# Patient Record
Sex: Female | Born: 1980 | Hispanic: Yes | Marital: Married | Smoking: Never smoker
Health system: Southern US, Community
[De-identification: ages and names within clinical notes are randomized; demographics above are authoritative.]

## PROBLEM LIST (undated history)

## (undated) DIAGNOSIS — K219 Gastro-esophageal reflux disease without esophagitis: Secondary | ICD-10-CM

## (undated) HISTORY — DX: Gastro-esophageal reflux disease without esophagitis: K21.9

---

## 2016-08-02 ENCOUNTER — Other Ambulatory Visit: Payer: Self-pay | Admitting: Family Medicine

## 2016-08-02 ENCOUNTER — Other Ambulatory Visit (HOSPITAL_COMMUNITY)
Admission: RE | Admit: 2016-08-02 | Discharge: 2016-08-02 | Disposition: A | Discharge status: Home / Self Care | Payer: BLUE CROSS/BLUE SHIELD | Source: Ambulatory Visit | Attending: Family Medicine | Admitting: Family Medicine

## 2016-08-02 DIAGNOSIS — Z124 Encounter for screening for malignant neoplasm of cervix: Secondary | ICD-10-CM | POA: Diagnosis present

## 2016-08-02 DIAGNOSIS — Z113 Encounter for screening for infections with a predominantly sexual mode of transmission: Secondary | ICD-10-CM | POA: Insufficient documentation

## 2016-08-02 DIAGNOSIS — Z1151 Encounter for screening for human papillomavirus (HPV): Secondary | ICD-10-CM | POA: Diagnosis present

## 2016-08-05 LAB — CYTOLOGY - PAP
Bacterial vaginitis: POSITIVE — AB
CHLAMYDIA, DNA PROBE: NEGATIVE
Candida vaginitis: NEGATIVE
Diagnosis: NEGATIVE
HPV: NOT DETECTED
NEISSERIA GONORRHEA: NEGATIVE
TRICH (WINDOWPATH): NEGATIVE

## 2017-01-21 ENCOUNTER — Ambulatory Visit (INDEPENDENT_AMBULATORY_CARE_PROVIDER_SITE_OTHER): Payer: BLUE CROSS/BLUE SHIELD | Admitting: Emergency Medicine

## 2017-01-21 ENCOUNTER — Encounter: Payer: Self-pay | Admitting: Emergency Medicine

## 2017-01-21 VITALS — BP 124/82 | HR 81 | Temp 98.1°F | Resp 18 | Ht 61.75 in | Wt 120.6 lb

## 2017-01-21 DIAGNOSIS — R1013 Epigastric pain: Secondary | ICD-10-CM | POA: Diagnosis not present

## 2017-01-21 DIAGNOSIS — R002 Palpitations: Secondary | ICD-10-CM | POA: Diagnosis not present

## 2017-01-21 DIAGNOSIS — G8929 Other chronic pain: Secondary | ICD-10-CM | POA: Diagnosis not present

## 2017-01-21 DIAGNOSIS — G44219 Episodic tension-type headache, not intractable: Secondary | ICD-10-CM | POA: Diagnosis not present

## 2017-01-21 MED ORDER — DICLOFENAC SODIUM 75 MG PO TBEC: 75.0000 mg | DELAYED_RELEASE_TABLET | Freq: Two times a day (BID) | ORAL | 0 refills | Status: AC

## 2017-01-21 NOTE — Progress Notes (Signed)
Kaitlin Suarez 36 y.o.   Chief Complaint  Patient presents with  . Headache    over a year. They wont go away     HISTORY OF PRESENT ILLNESS: This is a 36 y.o. female complaining of headache on and off for 1 year; married; from [place] two sons still in the [place] and trying to bring them back; high stress; works 6 days a week; works out regularly; has ocassional palpitations with intermittent bouts of epigastric pain; moves bowels 3-4 times/day, formed but soft.  Headache   This is a chronic problem. The current episode started more than 1 year ago. The problem occurs intermittently. The problem has been waxing and waning. The pain is located in the bilateral region. The pain does not radiate. The pain quality is similar to prior headaches. The quality of the pain is described as aching and band-like. Associated symptoms include abdominal pain. Pertinent negatives include no back pain, coughing, dizziness, fever, nausea, neck pain, seizures, sore throat, vomiting or weight loss. Photophobia: with headaches.     Prior to Admission medications   Not on File    No Known Allergies  There are no active problems to display for this patient.   History reviewed. No pertinent past medical history.  Past Surgical History:  Procedure Laterality Date  . CESAREAN SECTION      Social History   Social History  . Marital status: Married    Spouse name: N/A  . Number of children: N/A  . Years of education: N/A   Occupational History  . Not on file.   Social History Main Topics  . Smoking status: Never Smoker  . Smokeless tobacco: Never Used  . Alcohol use Not on file  . Drug use: Unknown  . Sexual activity: Not on file   Other Topics Concern  . Not on file   Social History Narrative  . No narrative on file    History reviewed. No pertinent family history.   Review of Systems  Constitutional: Negative.  Negative for chills, fever and weight loss.  HENT:  Negative.  Negative for congestion, nosebleeds and sore throat.   Eyes: Photophobia: with headaches.  Respiratory: Negative for cough, hemoptysis and shortness of breath.   Cardiovascular: Positive for palpitations.  Gastrointestinal: Positive for abdominal pain. Negative for nausea and vomiting.  Genitourinary: Negative for dysuria and hematuria.  Musculoskeletal: Negative for back pain, myalgias and neck pain.  Skin: Negative.  Negative for rash.  Neurological: Positive for headaches. Negative for dizziness, sensory change, focal weakness, seizures and loss of consciousness.  Psychiatric/Behavioral: The patient is nervous/anxious.   All other systems reviewed and are negative.  Vitals:   01/21/17 0833  BP: 124/82  Pulse: 81  Resp: 18  Temp: 98.1 F (36.7 C)     Physical Exam  Constitutional: She is oriented to person, place, and time. She appears well-developed and well-nourished.  HENT:  Head: Normocephalic and atraumatic.  Right Ear: External ear normal.  Left Ear: External ear normal.  Nose: Nose normal.  Mouth/Throat: Oropharynx is clear and moist.  Eyes: Conjunctivae and EOM are normal. Pupils are equal, round, and reactive to light.  Neck: Normal range of motion. Neck supple. No JVD present. No thyromegaly present.  Cardiovascular: Normal rate, regular rhythm, normal heart sounds and intact distal pulses.   Pulmonary/Chest: Effort normal and breath sounds normal.  Abdominal: Soft. Bowel sounds are normal. She exhibits no distension. There is no tenderness.  Musculoskeletal: Normal range of motion.  Lymphadenopathy:    She has no cervical adenopathy.  Neurological: She is alert and oriented to person, place, and time. She displays normal reflexes. No cranial nerve deficit or sensory deficit. She exhibits normal muscle tone. Coordination normal.  Skin: Skin is warm and dry. Capillary refill takes less than 2 seconds.  Psychiatric: She has a normal mood and affect. Her  behavior is normal.  Vitals reviewed.    ASSESSMENT & PLAN: Kaitlin Suarez was seen today for headache.  Diagnoses and all orders for this visit:  Episodic tension-type headache, not intractable -     CBC with Differential/Platelet -     Comprehensive metabolic panel -     TSH -     Sedimentation Rate -     diclofenac (VOLTAREN) 75 MG EC tablet; Take 1 tablet (75 mg total) by mouth 2 (two) times daily. As needed for headaches.  Palpitations -     Ambulatory referral to Cardiology  Abdominal pain, chronic, epigastric Comments: suspected IBS Orders: -     Ambulatory referral to Gastroenterology  Other orders -     Cancel: POCT urinalysis dipstick -     Cancel: Urine Culture -     Cancel: Urine Microscopic    Patient Instructions       IF you received an x-ray today, you will receive an invoice from [facility]. Please contact [facility] at [phone] with questions or concerns regarding your invoice.   IF you received labwork today, you will receive an invoice from [facility]. Please contact [facility] at [phone] with questions or concerns regarding your invoice.   Our billing staff will not be able to assist you with questions regarding bills from these companies.  You will be contacted with the lab results as soon as they are available. The fastest way to get your results is to activate your My Chart account. Instructions are located on the last page of this paperwork. If you have not heard from [place] regarding the results in 2 weeks, please contact this office.      Cefalea tensional (Tension Headache) Una cefalea tensional es un dolor o presin que se siente en la frente y los lados de la cabeza. Estas cefaleas pueden durar de 4minutos a varios das. CUIDADOS EN EL HOGAR Control del [employer] de venta libre y los recetados solamente como se lo haya indicado el mdico.  Cuando sienta dolor de cabeza acustese en un cuarto  oscuro y tranquilo.  Si se lo indican, aplquese hielo en la zona de la cabeza y el cuello: ? Ponga el hielo en una bolsa plstica. ? Coloque una [employer] piel y la bolsa de hielo. ? Coloque el hielo durante 69minutos, 2 a 3veces por da.  Utilice una almohadilla trmica o tome una ducha caliente para aplicar calor en la zona de la cabeza y el cuello segn las indicaciones del mdico. Comida y bebida  Mantenga un horario para las comidas.  No beba mucho alcohol.  No consuma mucha cafena ni deje de consumirla por completo. Instrucciones generales  Concurra a todas las visitas de control como se lo haya indicado el mdico. Esto es importante.  Lleve un registro diario para Neurosurgeon si ciertas cosas provocan los dolores de [place]. Por ejemplo, escriba los siguientes datos: ? Lo que usted come y bebe. ? Cunto tiempo duerme. ? Algn cambio en su dieta o en los medicamentos.  Pruebe recibir Engineer, production o hacer otras cosas que lo ayuden a  relajarse.  Disminuya el nivel de estrs.  Sintese con la espalda recta. No contraiga (tensione) los msculos.  No consuma productos que contengan tabaco. Estos incluyen cigarrillos, tabaco para mascar y Psychologist, sport and exercise. Si necesita ayuda para dejar de fumar, consulte al mdico.  Haga ejercicios con regularidad tal como se lo indic el mdico.  Duerma lo suficiente. Es Software engineer, entre 7 y 9horas de sueo. SOLICITE AYUDA SI:  Los medicamentos no logran [employer].  Tiene un dolor de cabeza que es diferente del dolor de cabeza habitual.  Tiene Tree surgeon estomacal (nuseas) o vomita.  Tiene fiebre. SOLICITE AYUDA DE INMEDIATO SI:  La cefalea es muy intensa.  Sigue vomitando.  Presenta rigidez en el cuello.  Tiene dificultad para ver.  Tiene dificultad para hablar.  Siente dolor en el ojo o en el odo.  Sus msculos estn dbiles, o pierde el control muscular.  Pierde el equilibrio o tiene problemas para  Writer.  Siente que se desvanece (pierde el conocimiento) o se desmaya.  Se siente confundido. Esta informacin no tiene Marine scientist el consejo del mdico. Asegrese de hacerle al mdico cualquier pregunta que tenga. Document Released: 10/04/2011 Document Revised: 04/02/2015 Document Reviewed: 11/04/2014 Elsevier Interactive Patient Education  2018 [employer].      Agustina Caroli, MD [facility]

## 2017-01-21 NOTE — Patient Instructions (Addendum)
     IF you received an x-ray today, you will receive an invoice from [facility]. Please contact [facility] at [phone] with questions or concerns regarding your invoice.   IF you received labwork today, you will receive an invoice from [facility]. Please contact [facility] at [phone] with questions or concerns regarding your invoice.   Our billing staff will not be able to assist you with questions regarding bills from these companies.  You will be contacted with the lab results as soon as they are available. The fastest way to get your results is to activate your My Chart account. Instructions are located on the last page of this paperwork. If you have not heard from us regarding the results in 2 weeks, please contact this office.      Cefalea tensional (Tension Headache) Una cefalea tensional es un dolor o presin que se siente en la frente y los lados de la cabeza. Estas cefaleas pueden durar de 30minutos a varios das. CUIDADOS EN EL HOGAR Control del dolor  Tome los medicamentos de venta libre y los recetados solamente como se lo haya indicado el mdico.  Cuando sienta dolor de cabeza acustese en un cuarto oscuro y tranquilo.  Si se lo indican, aplquese hielo en la zona de la cabeza y el cuello: ? Ponga el hielo en una bolsa plstica. ? Coloque una toalla entre la piel y la bolsa de hielo. ? Coloque el hielo durante 20minutos, 2 a 3veces por da.  Utilice una almohadilla trmica o tome una ducha caliente para aplicar calor en la zona de la cabeza y el cuello segn las indicaciones del mdico. Comida y bebida  Mantenga un horario para las comidas.  No beba mucho alcohol.  No consuma mucha cafena ni deje de consumirla por completo. Instrucciones generales  Concurra a todas las visitas de control como se lo haya indicado el mdico. Esto es importante.  Lleve un registro diario para averiguar si ciertas cosas provocan los dolores de cabeza.  Por ejemplo, escriba los siguientes datos: ? Lo que usted come y bebe. ? Cunto tiempo duerme. ? Algn cambio en su dieta o en los medicamentos.  Pruebe recibir un masaje o hacer otras cosas que lo ayuden a relajarse.  Disminuya el nivel de estrs.  Sintese con la espalda recta. No contraiga (tensione) los msculos.  No consuma productos que contengan tabaco. Estos incluyen cigarrillos, tabaco para mascar y cigarrillos electrnicos. Si necesita ayuda para dejar de fumar, consulte al mdico.  Haga ejercicios con regularidad tal como se lo indic el mdico.  Duerma lo suficiente. Es decir, entre 7 y 9horas de sueo. SOLICITE AYUDA SI:  Los medicamentos no logran aliviar los sntomas.  Tiene un dolor de cabeza que es diferente del dolor de cabeza habitual.  Tiene malestar estomacal (nuseas) o vomita.  Tiene fiebre. SOLICITE AYUDA DE INMEDIATO SI:  La cefalea es muy intensa.  Sigue vomitando.  Presenta rigidez en el cuello.  Tiene dificultad para ver.  Tiene dificultad para hablar.  Siente dolor en el ojo o en el odo.  Sus msculos estn dbiles, o pierde el control muscular.  Pierde el equilibrio o tiene problemas para caminar.  Siente que se desvanece (pierde el conocimiento) o se desmaya.  Se siente confundido. Esta informacin no tiene como fin reemplazar el consejo del mdico. Asegrese de hacerle al mdico cualquier pregunta que tenga. Document Released: 10/04/2011 Document Revised: 04/02/2015 Document Reviewed: 11/04/2014 Elsevier Interactive Patient Education  2018 Elsevier Inc.  

## 2017-01-22 LAB — CBC WITH DIFFERENTIAL/PLATELET
BASOS ABS: 0 10*3/uL (ref 0.0–0.2)
BASOS: 0 %
EOS (ABSOLUTE): 0.1 10*3/uL (ref 0.0–0.4)
EOS: 1 %
HEMATOCRIT: 44.5 % (ref 34.0–46.6)
HEMOGLOBIN: 15.1 g/dL (ref 11.1–15.9)
IMMATURE GRANS (ABS): 0 10*3/uL (ref 0.0–0.1)
Immature Granulocytes: 0 %
LYMPHS ABS: 1.5 10*3/uL (ref 0.7–3.1)
LYMPHS: 29 %
MCH: 31.8 pg (ref 26.6–33.0)
MCHC: 33.9 g/dL (ref 31.5–35.7)
MCV: 94 fL (ref 79–97)
MONOCYTES: 8 %
Monocytes Absolute: 0.4 10*3/uL (ref 0.1–0.9)
NEUTROS ABS: 3.4 10*3/uL (ref 1.4–7.0)
Neutrophils: 62 %
Platelets: 246 10*3/uL (ref 150–379)
RBC: 4.75 x10E6/uL (ref 3.77–5.28)
RDW: 12.8 % (ref 12.3–15.4)
WBC: 5.4 10*3/uL (ref 3.4–10.8)

## 2017-01-22 LAB — COMPREHENSIVE METABOLIC PANEL
ALBUMIN: 4.3 g/dL (ref 3.5–5.5)
ALT: 13 IU/L (ref 0–32)
AST: 17 IU/L (ref 0–40)
Albumin/Globulin Ratio: 1.8 (ref 1.2–2.2)
Alkaline Phosphatase: 49 IU/L (ref 39–117)
BILIRUBIN TOTAL: 0.4 mg/dL (ref 0.0–1.2)
BUN / CREAT RATIO: 12 (ref 9–23)
BUN: 8 mg/dL (ref 6–20)
CO2: 24 mmol/L (ref 20–29)
CREATININE: 0.65 mg/dL (ref 0.57–1.00)
Calcium: 9.5 mg/dL (ref 8.7–10.2)
Chloride: 106 mmol/L (ref 96–106)
GFR calc non Af Amer: 115 mL/min/{1.73_m2} (ref >59)
GFR, EST AFRICAN AMERICAN: 132 mL/min/{1.73_m2} (ref >59)
GLOBULIN, TOTAL: 2.4 g/dL (ref 1.5–4.5)
GLUCOSE: 77 mg/dL (ref 65–99)
Potassium: 4.5 mmol/L (ref 3.5–5.2)
SODIUM: 142 mmol/L (ref 134–144)
TOTAL PROTEIN: 6.7 g/dL (ref 6.0–8.5)

## 2017-01-22 LAB — SEDIMENTATION RATE: Sed Rate: 2 mm/hr (ref 0–32)

## 2017-01-22 LAB — TSH: TSH: 1.71 u[IU]/mL (ref 0.450–4.500)

## 2017-03-21 ENCOUNTER — Ambulatory Visit (INDEPENDENT_AMBULATORY_CARE_PROVIDER_SITE_OTHER): Payer: BLUE CROSS/BLUE SHIELD | Admitting: Emergency Medicine

## 2017-03-21 ENCOUNTER — Encounter: Payer: Self-pay | Admitting: Emergency Medicine

## 2017-03-21 VITALS — BP 125/81 | HR 81 | Temp 98.5°F | Resp 16 | Ht 61.0 in | Wt 119.0 lb

## 2017-03-21 DIAGNOSIS — Z23 Encounter for immunization: Secondary | ICD-10-CM | POA: Diagnosis not present

## 2017-03-21 DIAGNOSIS — S46812A Strain of other muscles, fascia and tendons at shoulder and upper arm level, left arm, initial encounter: Secondary | ICD-10-CM | POA: Diagnosis not present

## 2017-03-21 DIAGNOSIS — M791 Myalgia: Secondary | ICD-10-CM | POA: Diagnosis not present

## 2017-03-21 DIAGNOSIS — T148XXA Other injury of unspecified body region, initial encounter: Secondary | ICD-10-CM | POA: Diagnosis not present

## 2017-03-21 DIAGNOSIS — M7918 Myalgia, other site: Secondary | ICD-10-CM

## 2017-03-21 MED ORDER — CYCLOBENZAPRINE HCL 10 MG PO TABS: 10.0000 mg | ORAL_TABLET | Freq: Three times a day (TID) | ORAL | 0 refills | Status: DC | PRN

## 2017-03-21 MED ORDER — DICLOFENAC SODIUM 75 MG PO TBEC: 75.0000 mg | DELAYED_RELEASE_TABLET | Freq: Two times a day (BID) | ORAL | 0 refills | Status: DC

## 2017-03-21 MED ORDER — PREDNISONE 20 MG PO TABS: 40.0000 mg | ORAL_TABLET | Freq: Every day | ORAL | 0 refills | Status: AC

## 2017-03-21 NOTE — Patient Instructions (Addendum)
     IF you received an x-ray today, you will receive an invoice from [facility]. Please contact [facility] at [phone] with questions or concerns regarding your invoice.   IF you received labwork today, you will receive an invoice from [facility]. Please contact [facility] at [phone] with questions or concerns regarding your invoice.   Our billing staff will not be able to assist you with questions regarding bills from these companies.  You will be contacted with the lab results as soon as they are available. The fastest way to get your results is to activate your My Chart account. Instructions are located on the last page of this paperwork. If you have not heard from us regarding the results in 2 weeks, please contact this office.     Muscle Strain A muscle strain (pulled muscle) happens when a muscle is stretched beyond normal length. It happens when a sudden, violent force stretches your muscle too far. Usually, a few of the fibers in your muscle are torn. Muscle strain is common in athletes. Recovery usually takes 1-2 weeks. Complete healing takes 5-6 weeks. Follow these instructions at home:  Follow the PRICE method of treatment to help your injury get better. Do this the first 2-3 days after the injury: ? Protect. Protect the muscle to keep it from getting injured again. ? Rest. Limit your activity and rest the injured body part. ? Ice. Put ice in a plastic bag. Place a towel between your skin and the bag. Then, apply the ice and leave it on from 15-20 minutes each hour. After the third day, switch to moist heat packs. ? Compression. Use a splint or elastic bandage on the injured area for comfort. Do not put it on too tightly. ? Elevate. Keep the injured body part above the level of your heart.  Only take medicine as told by your doctor.  Warm up before doing exercise to prevent future muscle strains. Contact a doctor if:  You have more pain or puffiness  (swelling) in the injured area.  You feel numbness, tingling, or notice a loss of strength in the injured area. This information is not intended to replace advice given to you by your health care provider. Make sure you discuss any questions you have with your health care provider. Document Released: 04/20/2008 Document Revised: 12/18/2015 Document Reviewed: 02/08/2013 Elsevier Interactive Patient Education  2017 Elsevier Inc.  

## 2017-03-21 NOTE — Progress Notes (Signed)
Kaitlin Suarez 36 y.o.   Chief Complaint  Patient presents with  . Back Pain    LEFT side it started and  now it is the whole back-8 days    HISTORY OF PRESENT ILLNESS: This is a 36 y.o. female complaining of left sided upper back pain x several days.  Back Pain  This is a recurrent problem. The current episode started in the past 7 days. The problem occurs constantly. The problem has been gradually worsening since onset. The pain is present in the thoracic spine and lumbar spine (left trapezius). The quality of the pain is described as aching. The pain does not radiate. The pain is at a severity of 6/10. The pain is moderate. The symptoms are aggravated by position, bending and twisting. Stiffness is present all day. Pertinent negatives include no abdominal pain, bladder incontinence, bowel incontinence, chest pain, dysuria, fever, headaches, leg pain, numbness, paresthesias, tingling, weakness or weight loss. Risk factors: heavy lifting at work. She has tried analgesics and NSAIDs for the symptoms. The treatment provided no relief.     Prior to Admission medications   Medication Sig Start Date End Date Taking? Authorizing Provider  diclofenac (VOLTAREN) 75 MG EC tablet Take 75 mg by mouth 2 (two) times daily.   Yes [provider]  Naproxen Sodium (ALEVE PO) Take by mouth daily.   Yes [provider]    No Known Allergies  Patient Active Problem List   Diagnosis Date Noted  . Episodic tension-type headache, not intractable 01/21/2017  . Palpitations 01/21/2017  . Abdominal pain, chronic, epigastric 01/21/2017    No past medical history on file.  Past Surgical History:  Procedure Laterality Date  . CESAREAN SECTION      Social History   Social History  . Marital status: Married    Spouse name: N/A  . Number of children: N/A  . Years of education: N/A   Occupational History  . Not on file.   Social History Main Topics  . Smoking status: Never  Smoker  . Smokeless tobacco: Never Used  . Alcohol use Not on file  . Drug use: Unknown  . Sexual activity: Not on file   Other Topics Concern  . Not on file   Social History Narrative  . No narrative on file    No family history on file.   Review of Systems  Constitutional: Negative.  Negative for fever and weight loss.  HENT: Negative.   Eyes: Negative.  Negative for double vision.  Respiratory: Negative.  Negative for cough and shortness of breath.   Cardiovascular: Negative.  Negative for chest pain, palpitations and leg swelling.  Gastrointestinal: Negative.  Negative for abdominal pain, bowel incontinence, diarrhea, nausea and vomiting.  Genitourinary: Negative for bladder incontinence, dysuria and hematuria.  Musculoskeletal: Positive for back pain. Negative for joint pain, myalgias and neck pain.  Skin: Negative.  Negative for rash.  Neurological: Negative for tingling, sensory change, focal weakness, weakness, numbness, headaches and paresthesias.  Endo/Heme/Allergies: Negative.   All other systems reviewed and are negative.  Vitals:   03/21/17 1010  BP: 125/81  Pulse: 81  Resp: 16  Temp: 98.5 F (36.9 C)  SpO2: 100%     Physical Exam  Constitutional: She is oriented to person, place, and time. She appears well-developed and well-nourished.  HENT:  Head: Normocephalic and atraumatic.  Nose: Nose normal.  Mouth/Throat: Oropharynx is clear and moist. No oropharyngeal exudate.  Eyes: Pupils are equal, round, and reactive to  light. Conjunctivae and EOM are normal.  Neck: Normal range of motion. Neck supple. No JVD present. Muscular tenderness present. No thyromegaly present.  Cardiovascular: Normal rate, regular rhythm and normal heart sounds.   Pulmonary/Chest: Effort normal and breath sounds normal.  Abdominal: Soft. Bowel sounds are normal. She exhibits no distension. There is no tenderness.  Musculoskeletal:       Cervical back: She exhibits tenderness,  pain and spasm. She exhibits no bony tenderness.       Thoracic back: She exhibits decreased range of motion, tenderness, pain and spasm. She exhibits no bony tenderness.       Lumbar back: She exhibits decreased range of motion, tenderness, pain and spasm. She exhibits no bony tenderness.       Back:  Lymphadenopathy:    She has no cervical adenopathy.  Neurological: She is alert and oriented to person, place, and time. She displays normal reflexes. No sensory deficit. She exhibits normal muscle tone.  Skin: Skin is warm and dry. Capillary refill takes less than 2 seconds. No rash noted.  Psychiatric: She has a normal mood and affect. Her behavior is normal.  Vitals reviewed.    ASSESSMENT & PLAN: Kaitlin Suarez was seen today for back pain.  Diagnoses and all orders for this visit:  Muscle strain  Musculoskeletal pain  Strain of left trapezius muscle, initial encounter  Need for Tdap vaccination -     Tdap vaccine greater than or equal to 7yo IM  Need for prophylactic vaccination and inoculation against influenza -     Cancel: Flu Vaccine QUAD 36+ mos IM  Other orders -     predniSONE (DELTASONE) 20 MG tablet; Take 2 tablets (40 mg total) by mouth daily with breakfast. -     diclofenac (VOLTAREN) 75 MG EC tablet; Take 1 tablet (75 mg total) by mouth 2 (two) times daily. -     cyclobenzaprine (FLEXERIL) 10 MG tablet; Take 1 tablet (10 mg total) by mouth 3 (three) times daily as needed for muscle spasms.   Work note prepared and printed. Patient Instructions       IF you received an x-ray today, you will receive an invoice from [facility]. Please contact [facility] at [phone] with questions or concerns regarding your invoice.   IF you received labwork today, you will receive an invoice from [facility]. Please contact [facility] at [phone] with questions or concerns regarding your invoice.   Our billing staff will not be able to assist you with  questions regarding bills from these companies.  You will be contacted with the lab results as soon as they are available. The fastest way to get your results is to activate your My Chart account. Instructions are located on the last page of this paperwork. If you have not heard from [place] regarding the results in 2 weeks, please contact this office.     Muscle Strain A muscle strain (pulled muscle) happens when a muscle is stretched beyond normal length. It happens when a sudden, violent force stretches your muscle too far. Usually, a few of the fibers in your muscle are torn. Muscle strain is common in athletes. Recovery usually takes 1-2 weeks. Complete healing takes 5-6 weeks. Follow these instructions at home:  Follow the PRICE method of treatment to help your injury get better. Do this the first 2-3 days after the injury: ? Protect. Protect the muscle to keep it from getting injured again. ? Rest. Limit your activity and rest the injured body  part. ? Ice. Put ice in a plastic bag. Place a towel between your skin and the bag. Then, apply the ice and leave it on from 15-20 minutes each hour. After the third day, switch to moist heat packs. ? Compression. Use a splint or elastic bandage on the injured area for comfort. Do not put it on too tightly. ? Elevate. Keep the injured body part above the level of your heart.  Only take medicine as told by your doctor.  Warm up before doing exercise to prevent future muscle strains. Contact a doctor if:  You have more pain or puffiness (swelling) in the injured area.  You feel numbness, tingling, or notice a loss of strength in the injured area. This information is not intended to replace advice given to you by your health care provider. Make sure you discuss any questions you have with your health care provider. Document Released: 04/20/2008 Document Revised: 12/18/2015 Document Reviewed: 02/08/2013 Elsevier Interactive Patient Education  2017  Elsevier Inc.      Agustina Caroli, MD [facility]

## 2017-03-29 ENCOUNTER — Encounter: Payer: Self-pay | Admitting: Emergency Medicine

## 2017-03-29 ENCOUNTER — Ambulatory Visit (INDEPENDENT_AMBULATORY_CARE_PROVIDER_SITE_OTHER): Payer: BLUE CROSS/BLUE SHIELD

## 2017-03-29 ENCOUNTER — Ambulatory Visit (INDEPENDENT_AMBULATORY_CARE_PROVIDER_SITE_OTHER): Payer: BLUE CROSS/BLUE SHIELD | Admitting: Emergency Medicine

## 2017-03-29 VITALS — BP 121/80 | HR 91 | Temp 98.3°F | Resp 16 | Ht 60.75 in | Wt 119.6 lb

## 2017-03-29 DIAGNOSIS — M791 Myalgia: Secondary | ICD-10-CM

## 2017-03-29 DIAGNOSIS — R0789 Other chest pain: Secondary | ICD-10-CM | POA: Diagnosis not present

## 2017-03-29 DIAGNOSIS — T148XXA Other injury of unspecified body region, initial encounter: Secondary | ICD-10-CM

## 2017-03-29 DIAGNOSIS — M7918 Myalgia, other site: Secondary | ICD-10-CM

## 2017-03-29 MED ORDER — TRAMADOL HCL 50 MG PO TABS: 50.0000 mg | ORAL_TABLET | Freq: Three times a day (TID) | ORAL | 0 refills | Status: DC | PRN

## 2017-03-29 NOTE — Patient Instructions (Addendum)
IF you received an x-ray today, you will receive an invoice from [facility]. Please contact [facility] at [phone] with questions or concerns regarding your invoice.   IF you received labwork today, you will receive an invoice from [place]. Please contact [facility] at [phone] with questions or concerns regarding your invoice.   Our billing staff will not be able to assist you with questions regarding bills from these companies.  You will be contacted with the lab results as soon as they are available. The fastest way to get your results is to activate your My Chart account. Instructions are located on the last page of this paperwork. If you have not heard from [place] regarding the results in 2 weeks, please contact this office.     Muscle Strain A muscle strain (pulled muscle) happens when a muscle is stretched beyond normal length. It happens when a sudden, violent force stretches your muscle too far. Usually, a few of the fibers in your muscle are torn. Muscle strain is common in athletes. Recovery usually takes 1-2 weeks. Complete healing takes 5-6 weeks. Follow these instructions at home:  Follow the PRICE method of treatment to help your injury get better. Do this the first 2-3 days after the injury: ? Protect. Protect the muscle to keep it from getting injured again. ? Rest. Limit your activity and rest the injured body part. ? Ice. Put ice in a plastic bag. Place a towel between your skin and the bag. Then, apply the ice and leave it on from 15-20 minutes each hour. After the third day, switch to moist heat packs. ? Compression. Use a splint or elastic bandage on the injured area for comfort. Do not put it on too tightly. ? Elevate. Keep the injured body part above the level of your heart.  Only take medicine as told by your doctor.  Warm up before doing exercise to prevent future muscle strains. Contact a doctor if:  You have more pain or puffiness  (swelling) in the injured area.  You feel numbness, tingling, or notice a loss of strength in the injured area. This information is not intended to replace advice given to you by your health care provider. Make sure you discuss any questions you have with your health care provider. Document Released: 04/20/2008 Document Revised: 12/18/2015 Document Reviewed: 02/08/2013 Elsevier Interactive Patient Education  2017 [place] muscular. (Muscle Strain) Una distensin muscular (estiramiento muscular) ocurre cuando un msculo se estira ms all de la longitud normal. Mahlon Gammon cuando una fuerza violenta bruscamente estira demasiado el msculo. Generalmente se desgarran algunas de las fibras del msculo. La distensin muscular es comn en los atletas. La recuperacin normalmente tarda de 1 a 2semanas. La curacin completa tarda de 5 a 6semanas. CUIDADOS EN EL HOGAR  Siga el mtodo PRICE (por sus siglas en ingls) de tratamiento para que la lesin mejore. Hgalo durante los 2 a 3 primeros das despus de la lesin: ? Engineer, maintenance. Proteja el msculo para evitar que se vuelva a lesionar. ? Reposo. Limite la actividad y descanse la parte del cuerpo lesionada. ? Hielo. Ponga el hielo en una bolsa plstica. Coloque una toalla entre la piel y la bolsa de hielo. Luego aplique el hielo y djelo actuar de 5 a 110minutos por hora. Despus del Building control surveyor, cambie a compresas de calor hmedo. ? Compresin. Use una frula o venda elstica en la zona lesionada para brindar alivio. No la ajuste demasiado. ? Elevacin. Eleve la zona  lesionada por encima del nivel del corazn.  Solo tome los medicamentos que le haya indicado su mdico.  Realice un calentamiento antes de hacer ejercicio para prevenir distensiones musculares futuras.  SOLICITE AYUDA SI:  Siente ms dolor o inflamacin (hinchazn) en la zona lesionada.  Siente adormecimiento, hormigueo o nota una prdida de fuerza en la zona  lesionada.  ASEGRESE DE QUE:  Comprende estas instrucciones.  Controlar su afeccin.  Recibir ayuda de inmediato si no mejora o si empeora.  Esta informacin no tiene Marine scientist el consejo del mdico. Asegrese de hacerle al mdico cualquier pregunta que tenga. Document Released: 10/08/2008 Document Revised: 05/02/2013 Document Reviewed: 02/08/2013 Elsevier Interactive Patient Education  2017 [employer].

## 2017-03-29 NOTE — Progress Notes (Signed)
Kaitlin Suarez 36 y.o.   Chief Complaint  Patient presents with  . Follow-up    previous OV    HISTORY OF PRESENT ILLNESS: This is a 36 y.o. female complaining of persistent pain to left upper back, upper chest, neck, radiating down into left arm; sharp almost constant pain x 2 weeks without trauma; at times gets palpitations and dizziness; +Fhx of heart conditions; non-smoker. Pain happens at rest equally at work or at home, almost daily. Pre-menopausal healthy woman with no significant PMHx.  HPI   Prior to Admission medications   Medication Sig Start Date End Date Taking? Authorizing Provider  cyclobenzaprine (FLEXERIL) 10 MG tablet Take 1 tablet (10 mg total) by mouth 3 (three) times daily as needed for muscle spasms. 03/21/17  Yes Nathanuel Cabreja, Ines Bloomer, MD  Naproxen Sodium (ALEVE PO) Take by mouth daily.   Yes [provider]  predniSONE (DELTASONE) 20 MG tablet Take 20 mg by mouth daily with breakfast.   Yes [provider]    No Known Allergies  Patient Active Problem List   Diagnosis Date Noted  . Musculoskeletal pain 03/21/2017  . Muscle strain 03/21/2017  . Strain of left trapezius muscle 03/21/2017  . Need for Tdap vaccination 03/21/2017  . Need for prophylactic vaccination and inoculation against influenza 03/21/2017  . Episodic tension-type headache, not intractable 01/21/2017  . Palpitations 01/21/2017  . Abdominal pain, chronic, epigastric 01/21/2017    No past medical history on file.  Past Surgical History:  Procedure Laterality Date  . CESAREAN SECTION      Social History   Social History  . Marital status: Married    Spouse name: N/A  . Number of children: N/A  . Years of education: N/A   Occupational History  . Not on file.   Social History Main Topics  . Smoking status: Never Smoker  . Smokeless tobacco: Never Used  . Alcohol use Not on file  . Drug use: Unknown  . Sexual activity: Not on file   Other Topics Concern    . Not on file   Social History Narrative  . No narrative on file    No family history on file.   Review of Systems  Constitutional: Negative.  Negative for chills, diaphoresis, fever, malaise/fatigue and weight loss.  HENT: Negative.   Eyes: Negative.  Negative for blurred vision and double vision.  Respiratory: Negative.  Negative for cough, hemoptysis and shortness of breath.   Cardiovascular: Positive for chest pain and palpitations. Negative for claudication and leg swelling.  Gastrointestinal: Negative.  Negative for abdominal pain, diarrhea, nausea and vomiting.  Genitourinary: Negative.  Negative for dysuria and hematuria.  Musculoskeletal: Positive for back pain and neck pain. Negative for joint pain and myalgias.  Skin: Negative.  Negative for rash.  Neurological: Positive for dizziness. Negative for sensory change, focal weakness and headaches.  All other systems reviewed and are negative.   Vitals:   03/29/17 0814  BP: 121/80  Pulse: 91  Resp: 16  Temp: 98.3 F (36.8 C)  SpO2: 100%    Physical Exam  Constitutional: She is oriented to person, place, and time. She appears well-developed and well-nourished.  HENT:  Head: Normocephalic and atraumatic.  Right Ear: Tympanic membrane and external ear normal.  Left Ear: Tympanic membrane and external ear normal.  Mouth/Throat: Oropharynx is clear and moist.  Eyes: Pupils are equal, round, and reactive to light. Conjunctivae and EOM are normal.  Neck: Normal range of motion. Neck supple. No  JVD present. No thyromegaly present.  Cardiovascular: Normal rate, regular rhythm and normal heart sounds.   Pulmonary/Chest: Effort normal and breath sounds normal. She has no wheezes. She has no rales.  Abdominal: Soft. She exhibits no distension. There is no tenderness.  Musculoskeletal: Normal range of motion.  +tenderness to left cervical paraspinous muscles, left trapezius, left upper chest  Lymphadenopathy:    She has no  cervical adenopathy.  Neurological: She is alert and oriented to person, place, and time. No sensory deficit. She exhibits normal muscle tone.  Skin: Skin is warm and dry. Capillary refill takes less than 2 seconds. No rash noted.  Psychiatric: She has a normal mood and affect. Her behavior is normal.  Vitals reviewed. CXR: NAD, reviewed by me.  Dg Chest 2 View  Result Date: 03/29/2017 CLINICAL DATA:  Chest pain. EXAM: CHEST  2 VIEW COMPARISON:  None. FINDINGS: The heart size and mediastinal contours are within normal limits. Both lungs are clear. No pneumothorax or pleural effusion is noted. The visualized skeletal structures are unremarkable. IMPRESSION: No active cardiopulmonary disease. Electronically Signed   By: Marijo Conception, M.D.   On: 03/29/2017 08:44   EKG: NSR; no old EKG to compare with; no acute ischemic changes.   ASSESSMENT & PLAN:  Virgin was seen today for follow-up.  Diagnoses and all orders for this visit:  Musculoskeletal pain -     CBC with Differential/Platelet -     Sedimentation Rate -     Ambulatory referral to Physical Therapy  Muscle strain -     Ambulatory referral to Physical Therapy  Atypical chest pain -     Comprehensive metabolic panel -     EKG 53-IRWE -     DG Chest 2 View; Future  Other orders -     traMADol (ULTRAM) 50 MG tablet; Take 1 tablet (50 mg total) by mouth every 8 (eight) hours as needed.    Patient Instructions       IF you received an x-ray today, you will receive an invoice from [facility]. Please contact [facility] at [phone] with questions or concerns regarding your invoice.   IF you received labwork today, you will receive an invoice from [place]. Please contact [facility] at [phone] with questions or concerns regarding your invoice.   Our billing staff will not be able to assist you with questions regarding bills from these companies.  You will be contacted with the lab results as  soon as they are available. The fastest way to get your results is to activate your My Chart account. Instructions are located on the last page of this paperwork. If you have not heard from [place] regarding the results in 2 weeks, please contact this office.     Muscle Strain A muscle strain (pulled muscle) happens when a muscle is stretched beyond normal length. It happens when a sudden, violent force stretches your muscle too far. Usually, a few of the fibers in your muscle are torn. Muscle strain is common in athletes. Recovery usually takes 1-2 weeks. Complete healing takes 5-6 weeks. Follow these instructions at home:  Follow the PRICE method of treatment to help your injury get better. Do this the first 2-3 days after the injury: ? Protect. Protect the muscle to keep it from getting injured again. ? Rest. Limit your activity and rest the injured body part. ? Ice. Put ice in a plastic bag. Place a towel between your skin and the bag. Then, apply the  ice and leave it on from 15-20 minutes each hour. After the third day, switch to moist heat packs. ? Compression. Use a splint or elastic bandage on the injured area for comfort. Do not put it on too tightly. ? Elevate. Keep the injured body part above the level of your heart.  Only take medicine as told by your doctor.  Warm up before doing exercise to prevent future muscle strains. Contact a doctor if:  You have more pain or puffiness (swelling) in the injured area.  You feel numbness, tingling, or notice a loss of strength in the injured area. This information is not intended to replace advice given to you by your health care provider. Make sure you discuss any questions you have with your health care provider. Document Released: 04/20/2008 Document Revised: 12/18/2015 Document Reviewed: 02/08/2013 Elsevier Interactive Patient Education  2017 [place] muscular. (Muscle Strain) Una distensin muscular (estiramiento  muscular) ocurre cuando un msculo se estira ms all de la longitud normal. Mahlon Gammon cuando una fuerza violenta bruscamente estira demasiado el msculo. Generalmente se desgarran algunas de las fibras del msculo. La distensin muscular es comn en los atletas. La recuperacin normalmente tarda de 1 a 2semanas. La curacin completa tarda de 5 a 6semanas. CUIDADOS EN EL HOGAR  Siga el mtodo PRICE (por sus siglas en ingls) de tratamiento para que la lesin mejore. Hgalo durante los 2 a 3 primeros das despus de la lesin: ? Engineer, maintenance. Proteja el msculo para evitar que se vuelva a lesionar. ? Reposo. Limite la actividad y descanse la parte del cuerpo lesionada. ? Hielo. Ponga el hielo en una bolsa plstica. Coloque una toalla entre la piel y la bolsa de hielo. Luego aplique el hielo y djelo actuar de 63 a 66minutos por hora. Despus del Building control surveyor, cambie a compresas de calor hmedo. ? Compresin. Use una frula o venda elstica en la zona lesionada para brindar alivio. No la ajuste demasiado. ? Elevacin. Eleve la zona lesionada por encima del nivel del corazn.  Solo tome los medicamentos que le haya indicado su mdico.  Realice un calentamiento antes de hacer ejercicio para prevenir distensiones musculares futuras.  SOLICITE AYUDA SI:  Siente ms dolor o inflamacin (hinchazn) en la zona lesionada.  Siente adormecimiento, hormigueo o nota una prdida de fuerza en la zona lesionada.  ASEGRESE DE QUE:  Comprende estas instrucciones.  Controlar su afeccin.  Recibir ayuda de inmediato si no mejora o si empeora.  Esta informacin no tiene Marine scientist el consejo del mdico. Asegrese de hacerle al mdico cualquier pregunta que tenga. Document Released: 10/08/2008 Document Revised: 05/02/2013 Document Reviewed: 02/08/2013 Elsevier Interactive Patient Education  2017 [employer] Inc.      Agustina Caroli, MD [facility]

## 2017-03-30 LAB — COMPREHENSIVE METABOLIC PANEL
ALT: 14 IU/L (ref 0–32)
AST: 18 IU/L (ref 0–40)
Albumin/Globulin Ratio: 1.4 (ref 1.2–2.2)
Albumin: 4.2 g/dL (ref 3.5–5.5)
Alkaline Phosphatase: 43 IU/L (ref 39–117)
BILIRUBIN TOTAL: 0.3 mg/dL (ref 0.0–1.2)
BUN/Creatinine Ratio: 16 (ref 9–23)
BUN: 12 mg/dL (ref 6–20)
CHLORIDE: 104 mmol/L (ref 96–106)
CO2: 20 mmol/L (ref 20–29)
CREATININE: 0.76 mg/dL (ref 0.57–1.00)
Calcium: 8.9 mg/dL (ref 8.7–10.2)
GFR calc Af Amer: 116 mL/min/{1.73_m2} (ref >59)
GFR calc non Af Amer: 101 mL/min/{1.73_m2} (ref >59)
GLOBULIN, TOTAL: 3 g/dL (ref 1.5–4.5)
Glucose: 78 mg/dL (ref 65–99)
Potassium: 4 mmol/L (ref 3.5–5.2)
SODIUM: 141 mmol/L (ref 134–144)
Total Protein: 7.2 g/dL (ref 6.0–8.5)

## 2017-03-30 LAB — CBC WITH DIFFERENTIAL/PLATELET
Basophils Absolute: 0 10*3/uL (ref 0.0–0.2)
Basos: 1 %
EOS (ABSOLUTE): 0.2 10*3/uL (ref 0.0–0.4)
EOS: 4 %
Hematocrit: 44 % (ref 34.0–46.6)
Hemoglobin: 14.6 g/dL (ref 11.1–15.9)
IMMATURE GRANULOCYTES: 0 %
Immature Grans (Abs): 0 10*3/uL (ref 0.0–0.1)
LYMPHS ABS: 1.8 10*3/uL (ref 0.7–3.1)
Lymphs: 33 %
MCH: 31.7 pg (ref 26.6–33.0)
MCHC: 33.2 g/dL (ref 31.5–35.7)
MCV: 96 fL (ref 79–97)
MONOS ABS: 0.5 10*3/uL (ref 0.1–0.9)
Monocytes: 9 %
NEUTROS PCT: 53 %
Neutrophils Absolute: 2.9 10*3/uL (ref 1.4–7.0)
Platelets: 259 10*3/uL (ref 150–379)
RBC: 4.6 x10E6/uL (ref 3.77–5.28)
RDW: 13.1 % (ref 12.3–15.4)
WBC: 5.4 10*3/uL (ref 3.4–10.8)

## 2017-03-30 LAB — SEDIMENTATION RATE: SED RATE: 2 mm/h (ref 0–32)

## 2017-03-31 ENCOUNTER — Encounter: Payer: Self-pay | Admitting: Radiology

## 2017-04-01 ENCOUNTER — Other Ambulatory Visit: Payer: Self-pay | Admitting: Emergency Medicine

## 2018-02-25 IMAGING — DX DG CHEST 2V
2 series · 2 of 2 positions shown · non-contrast
Comparison: None.

CLINICAL DATA: Chest pain.

EXAM:
CHEST  2 VIEW

[chest pa]
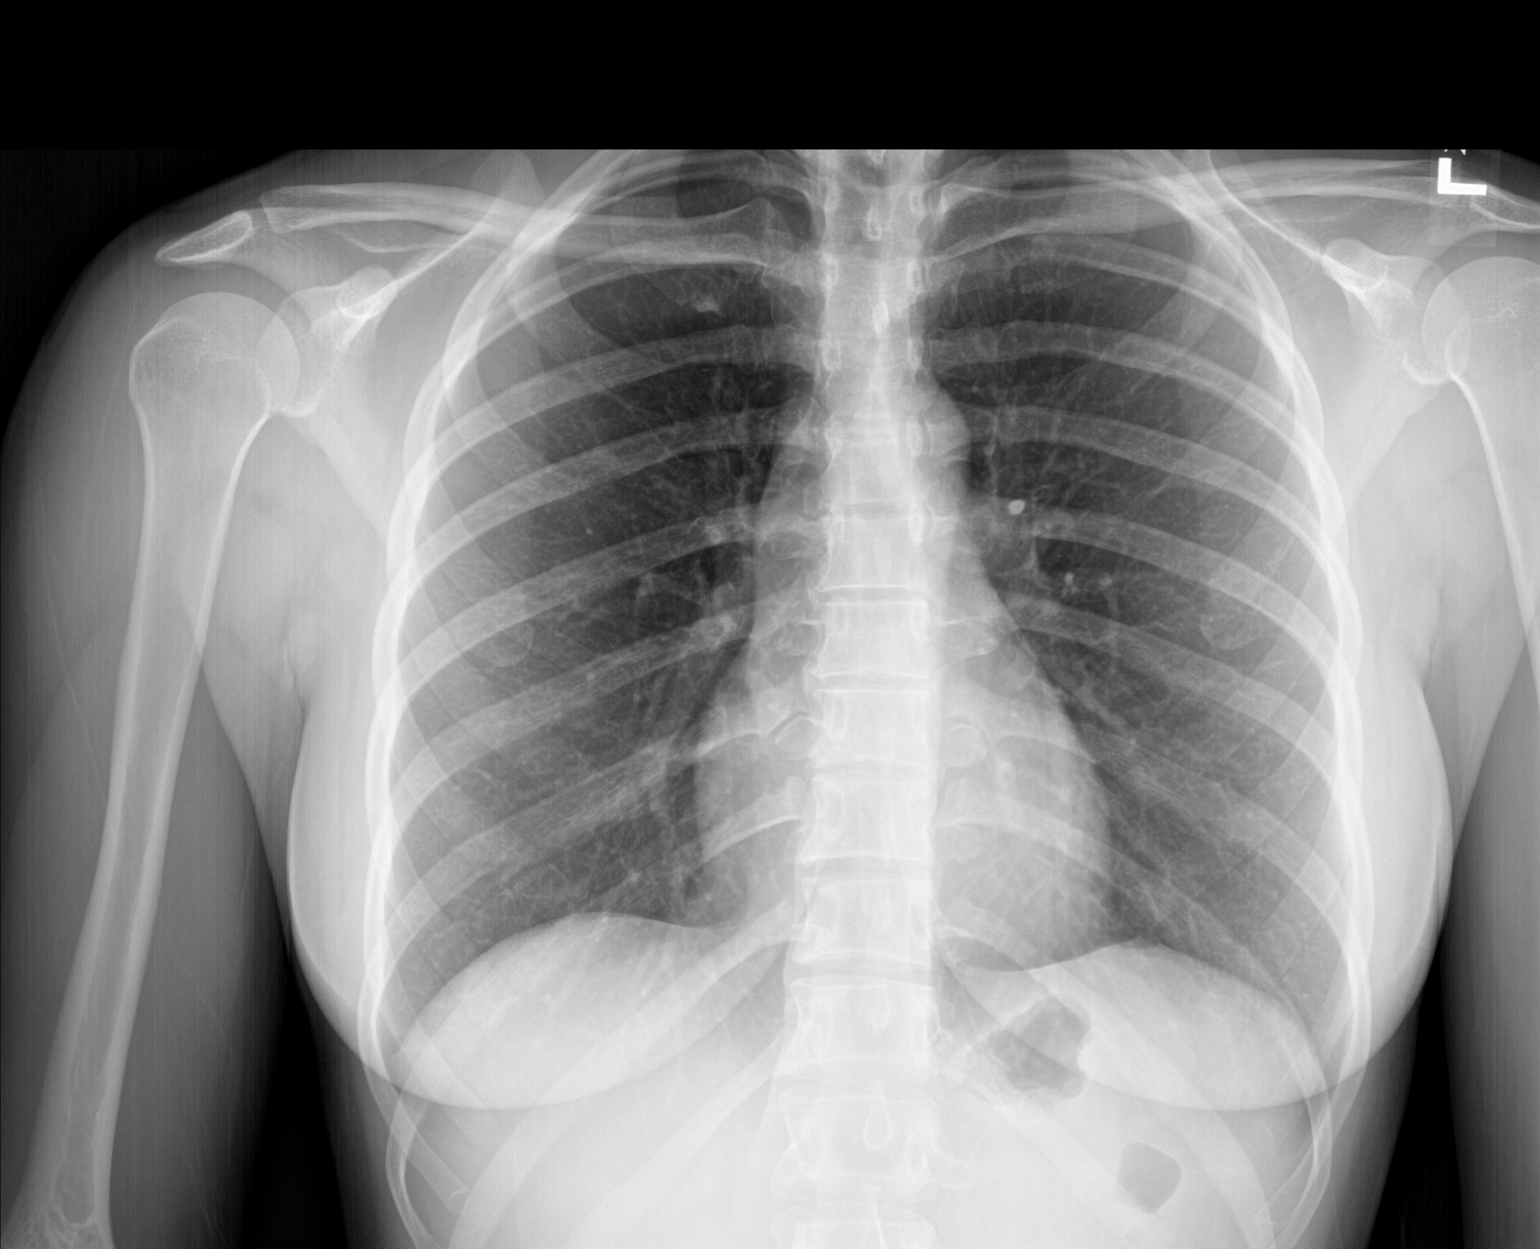

[chest lat]
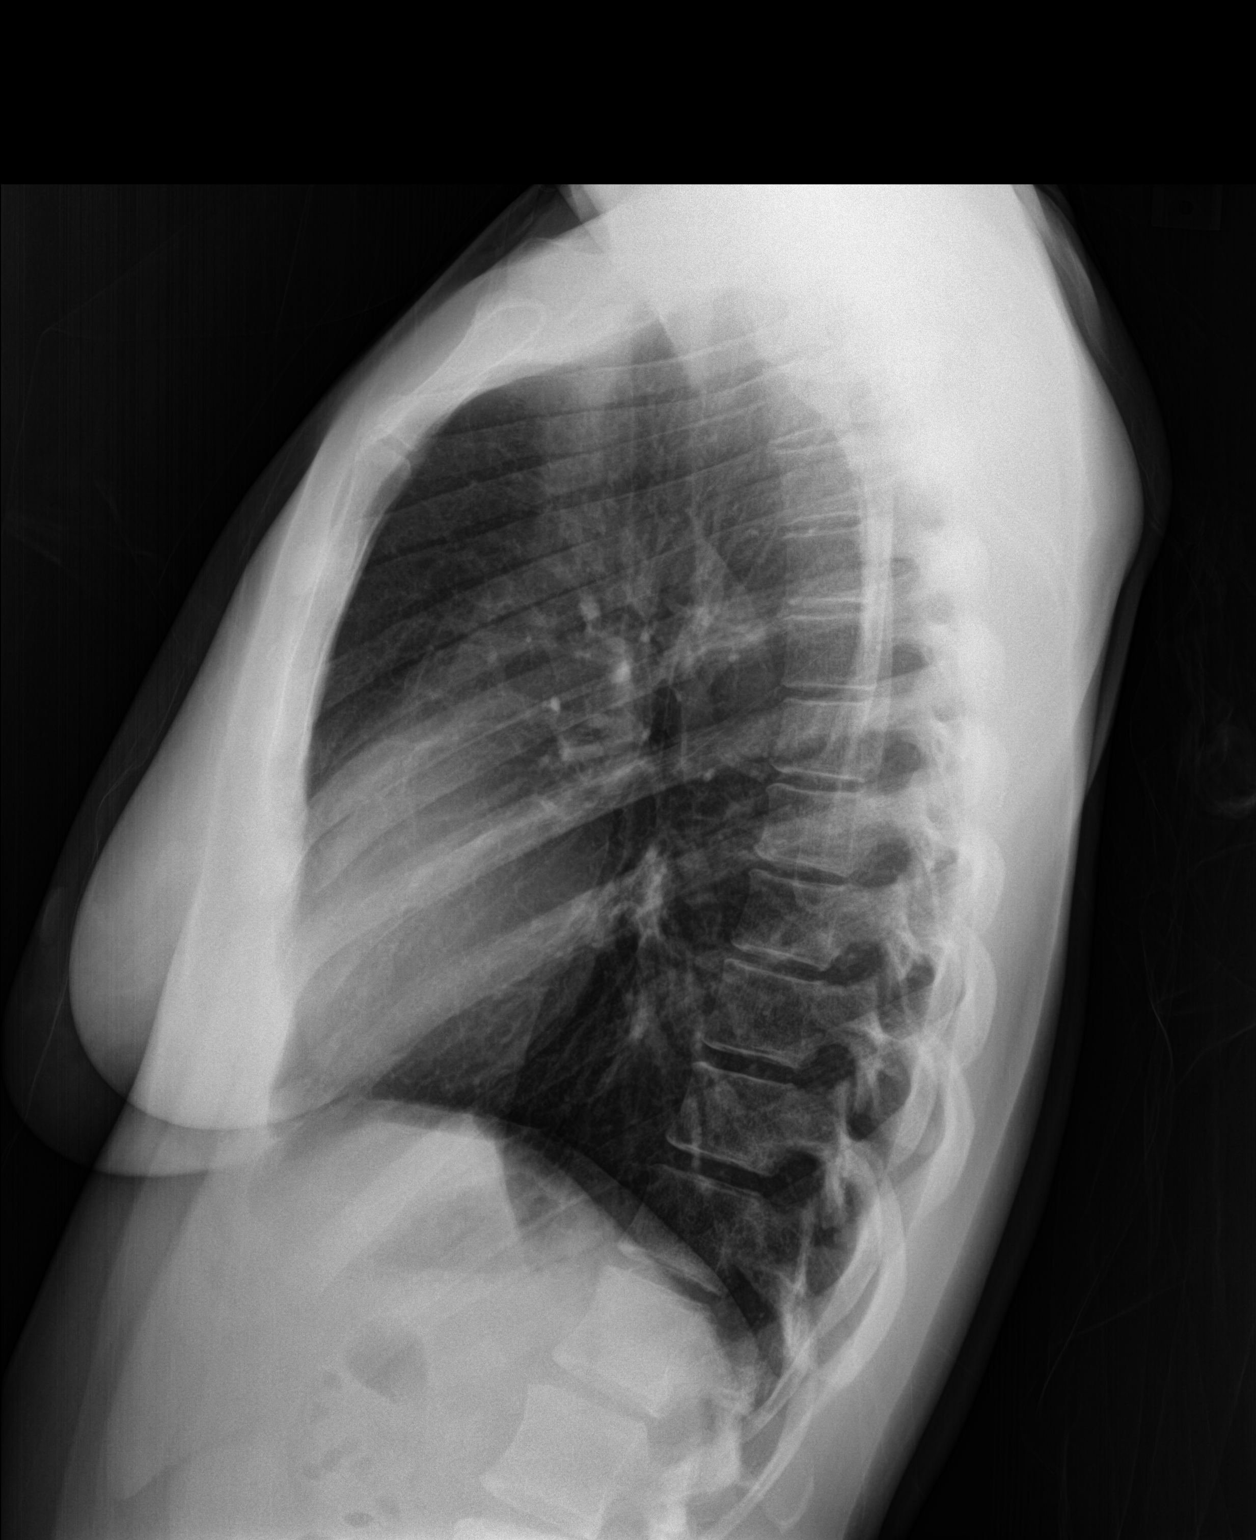

[2 of 2 positions shown; findings below may reference images not displayed]

FINDINGS: The heart size and mediastinal contours are within normal limits.
Both lungs are clear. No pneumothorax or pleural effusion is noted.
The visualized skeletal structures are unremarkable.
IMPRESSION: No active cardiopulmonary disease.

## 2018-08-23 ENCOUNTER — Encounter: Payer: Self-pay | Admitting: Emergency Medicine

## 2018-08-23 ENCOUNTER — Ambulatory Visit: Payer: BLUE CROSS/BLUE SHIELD | Admitting: Emergency Medicine

## 2018-08-23 ENCOUNTER — Other Ambulatory Visit: Payer: Self-pay

## 2018-08-23 VITALS — BP 121/75 | HR 82 | Temp 99.0°F | Resp 16 | Ht 61.0 in | Wt 135.8 lb

## 2018-08-23 DIAGNOSIS — K2900 Acute gastritis without bleeding: Secondary | ICD-10-CM | POA: Diagnosis not present

## 2018-08-23 DIAGNOSIS — G8929 Other chronic pain: Secondary | ICD-10-CM | POA: Insufficient documentation

## 2018-08-23 DIAGNOSIS — M25512 Pain in left shoulder: Secondary | ICD-10-CM

## 2018-08-23 DIAGNOSIS — R1013 Epigastric pain: Secondary | ICD-10-CM

## 2018-08-23 DIAGNOSIS — R519 Headache, unspecified: Secondary | ICD-10-CM | POA: Insufficient documentation

## 2018-08-23 DIAGNOSIS — R51 Headache: Secondary | ICD-10-CM | POA: Diagnosis not present

## 2018-08-23 DIAGNOSIS — F1990 Other psychoactive substance use, unspecified, uncomplicated: Secondary | ICD-10-CM | POA: Diagnosis not present

## 2018-08-23 DIAGNOSIS — L989 Disorder of the skin and subcutaneous tissue, unspecified: Secondary | ICD-10-CM

## 2018-08-23 DIAGNOSIS — K2970 Gastritis, unspecified, without bleeding: Secondary | ICD-10-CM | POA: Insufficient documentation

## 2018-08-23 MED ORDER — OMEPRAZOLE 40 MG PO CPDR: 40.0000 mg | DELAYED_RELEASE_CAPSULE | Freq: Every day | ORAL | 3 refills | Status: DC

## 2018-08-23 MED ORDER — FAMOTIDINE 40 MG PO TABS: 40.0000 mg | ORAL_TABLET | Freq: Every day | ORAL | 1 refills | Status: DC

## 2018-08-23 NOTE — Progress Notes (Signed)
Kaitlin Suarez 38 y.o.   Chief Complaint  Patient presents with  . Abdominal Pain    x 1 weeks with diarrhea    HISTORY OF PRESENT ILLNESS: This is a 38 y.o. female with several complaints: 1.  Epigastric/left upper quadrant abdominal pain for 1 week, worse with food in particular coffee, chocolate, milk, soda.  Also having some diarrhea.  Denies rectal bleeding or melena.  Took some Pepto-Bismol.  Has been taking ibuprofen and Aleve almost daily for the past 3 months due to left,.  Denies nausea or vomiting.  No fever or chills. 2.  Chronic left shoulder pain, most likely due to activity at work. 3.  Chronic headaches.  Has a history of migraine headaches.  Never seen by neurologist.  Sumatriptan helps. 4.  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  HPI   Prior to Admission medications   Medication Sig Start Date End Date Taking? Authorizing Provider  cyclobenzaprine (FLEXERIL) 10 MG tablet Take 1 tablet (10 mg total) by mouth 3 (three) times daily as needed for muscle spasms. 03/21/17   Horald Pollen, MD  diclofenac (VOLTAREN) 75 MG EC tablet TAKE 1 TABLET BY MOUTH TWICE A DAY 04/01/17   Horald Pollen, MD  Naproxen Sodium (ALEVE PO) Take by mouth daily.    [provider]  predniSONE (DELTASONE) 20 MG tablet Take 20 mg by mouth daily with breakfast.    [provider]  traMADol (ULTRAM) 50 MG tablet Take 1 tablet (50 mg total) by mouth every 8 (eight) hours as needed. 03/29/17   Horald Pollen, MD    No Known Allergies  Patient Active Problem List   Diagnosis Date Noted  . Musculoskeletal pain 03/21/2017  . Muscle strain 03/21/2017  . Strain of left trapezius muscle 03/21/2017  . Need for Tdap vaccination 03/21/2017  . Need for prophylactic vaccination and inoculation against influenza 03/21/2017  . Episodic tension-type headache, not intractable 01/21/2017  . Palpitations 01/21/2017  . Abdominal pain, chronic, epigastric 01/21/2017    No past  medical history on file.  Past Surgical History:  Procedure Laterality Date  . CESAREAN SECTION      Social History   Socioeconomic History  . Marital status: Married    Spouse name: Not on file  . Number of children: Not on file  . Years of education: Not on file  . Highest education level: Not on file  Occupational History  . Not on file  Social Needs  . Financial resource strain: Not on file  . Food insecurity:    Worry: Not on file    Inability: Not on file  . Transportation needs:    Medical: Not on file    Non-medical: Not on file  Tobacco Use  . Smoking status: Never Smoker  . Smokeless tobacco: Never Used  Substance and Sexual Activity  . Alcohol use: Not on file  . Drug use: Not on file  . Sexual activity: Not on file  Lifestyle  . Physical activity:    Days per week: Not on file    Minutes per session: Not on file  . Stress: Not on file  Relationships  . Social connections:    Talks on phone: Not on file    Gets together: Not on file    Attends religious service: Not on file    Active member of club or organization: Not on file    Attends meetings of clubs or organizations: Not on file    Relationship  status: Not on file  . Intimate partner violence:    Fear of current or ex partner: Not on file    Emotionally abused: Not on file    Physically abused: Not on file    Forced sexual activity: Not on file  Other Topics Concern  . Not on file  Social History Narrative  . Not on file    No family history on file.   Review of Systems  Constitutional: Negative.  Negative for chills and fever.  HENT: Negative.   Eyes: Negative.  Negative for discharge and redness.  Respiratory: Negative.  Negative for cough and shortness of breath.   Cardiovascular: Negative.  Negative for chest pain and palpitations.  Gastrointestinal: Positive for abdominal pain and diarrhea. Negative for blood in stool, heartburn, melena and vomiting.  Genitourinary: Negative.   Negative for dysuria and hematuria.  Musculoskeletal: Negative.   Skin: Negative.  Negative for rash.  Neurological: Negative for dizziness and headaches.  All other systems reviewed and are negative.   Vitals:   08/23/18 0819  BP: 121/75  Pulse: 82  Resp: 16  Temp: 99 F (37.2 C)  SpO2: 100%    Physical Exam Constitutional:      Appearance: Normal appearance.  HENT:     Head: Normocephalic and atraumatic.     Nose: Nose normal.     Mouth/Throat:     Mouth: Mucous membranes are moist.     Pharynx: Oropharynx is clear.  Eyes:     Extraocular Movements: Extraocular movements intact.     Conjunctiva/sclera: Conjunctivae normal.     Pupils: Pupils are equal, round, and reactive to light.  Neck:     Musculoskeletal: Normal range of motion and neck supple.  Cardiovascular:     Rate and Rhythm: Normal rate and regular rhythm.     Heart sounds: Normal heart sounds.  Pulmonary:     Effort: Pulmonary effort is normal.     Breath sounds: Normal breath sounds.  Abdominal:     General: There is no distension.     Palpations: There is no mass.     Tenderness: There is abdominal tenderness in the epigastric area and left upper quadrant. There is no guarding or rebound.  Musculoskeletal: Normal range of motion.  Skin:    General: Skin is warm and dry.  Neurological:     General: No focal deficit present.     Mental Status: She is alert and oriented to person, place, and time.  Psychiatric:        Mood and Affect: Mood normal.        Behavior: Behavior normal.       A total of 40 minutes was spent in the room with the patient, greater than 50% of which was in counseling/coordination of care regarding differential diagnosis, chronic medical problems, treatment and management, prognosis, medications and side effects, and need for follow-up in 4 weeks.  ASSESSMENT & PLAN: Epigastric pain Most likely acute gastritis secondary to excessive use of NSAIDs over the past 4 weeks.   Will treat with PPI and H2 blocker and follow-up in 4 weeks.  Ona was seen today for abdominal pain.  Diagnoses and all orders for this visit:  Epigastric pain -     CBC with Differential/Platelet -     Comprehensive metabolic panel -     Lipase  Other acute gastritis without hemorrhage -     omeprazole (PRILOSEC) 40 MG capsule; Take 1 capsule (40 mg total) by  mouth daily. -     famotidine (PEPCID) 40 MG tablet; Take 1 tablet (40 mg total) by mouth at bedtime for 30 days.  Excessive use of nonsteroidal anti-inflammatory drug (NSAID)  Chronic nonintractable headache, unspecified headache type -     Ambulatory referral to Neurology  Skin lesion Comments: Scalp Orders: -     Ambulatory referral to Dermatology  Chronic pain in left shoulder    Patient Instructions   Do not take NSAIDs such as Aleve, ibuprofen, Motrin.  Only take Tylenol for pain if needed.    If you have lab work done today you will be contacted with your lab results within the next 2 weeks.  If you have not heard from [place] then please contact us. The fastest way to get your results is to register for My Chart.   IF you received an x-ray today, you will receive an invoice from [facility]. Please contact [facility] at [phone] with questions or concerns regarding your invoice.   IF you received labwork today, you will receive an invoice from [facility]. Please contact [facility] at [phone] with questions or concerns regarding your invoice.   Our billing staff will not be able to assist you with questions regarding bills from these companies.  You will be contacted with the lab results as soon as they are available. The fastest way to get your results is to activate your My Chart account. Instructions are located on the last page of this paperwork. If you have not heard from [place] regarding the results in 2 weeks, please contact this office.     Gastritis - Adultos Gastritis,  Adult  La gastritis es una hinchazn (inflamacin) del estmago. La gastritis puede desarrollarse rpidamente (aguda). Tambin puede desarrollarse lentamente con el transcurso del tiempo (crnica). Es importante recibir ayuda para Personnel officer. Si no obtiene [place], Nurse, children's y Chief Executive Officer (lceras) en el [employer]. Cules son las causas? Esta afeccin puede ser causada por lo siguiente:  Grmenes que llegan al [employer].  Beber alcohol en exceso.  Los medicamentos que toma.  Demasiado cido en el estmago.  Una enfermedad del intestino o del estmago.  Estrs.  Una reaccin alrgica.  Enfermedad de Crohn.  Algunos tratamientos para el cncer (radiacin). A veces, se desconoce la causa de esta afeccin. Cules son los signos o los sntomas? Los sntomas de esta afeccin incluyen los siguientes:  Research scientist (life sciences).  Una sensacin de Science writer.  Ganas de vomitar (nuseas).  Vmitos.  Sensacin de estar demasiado lleno luego de comer.  Prdida de peso.  Mal aliento.  Vomitar sangre.  Sangre en las heces. Cmo se diagnostica? Esta afeccin puede diagnosticarse en funcin de lo siguiente:  Los antecedentes mdicos y sntomas.  Un examen fsico.  Estudios. Estos pueden incluir los siguientes: ? Anlisis de [place]. ? Pruebas de heces. ? Un procedimiento para observar el interior del estmago (endoscopa alta). ? Un estudio en el cual se toma una muestra de tejido para analizarlo (biopsia). Cmo se trata? El tratamiento de esta afeccin depende de la causa. Es posible que le administren lo siguiente:  Antibiticos si la afeccin es causada por grmenes.  Bloqueadores H2 y medicamentos similares, si la afeccin fue causada por una gran cantidad de cido. Siga estas indicaciones en su casa: Medicamentos  [employer] de venta libre y los recetados solamente como se lo haya indicado el mdico.  Si le  recetaron un antibitico, tmelo como se  lo haya indicado el mdico. No deje de tomarlo aunque comience a sentirse mejor. Comida y bebida   Haga comidas pequeas y frecuentes en lugar de comidas abundantes.  Evite los alimentos y las bebidas que intensifican los sntomas.  Beba suficiente lquido para Consulting civil engineer orina de color amarillo plido. Consumo de alcohol  No beba alcohol si: ? El mdico le indica que no lo haga. ? Est embarazada, puede estar embarazada o est tratando de quedar embarazada.  Si bebe alcohol: ? Limite su uso a las siguientes medidas:  De 0 a 1 medida por da para las mujeres.  De 0 a 2 medidas por da para los hombres. ? Est atento a la cantidad de alcohol que hay en las bebidas que toma. En los [place], una medida equivale a una botella de cerveza de 12oz (332ml), un vaso de vino de 5oz (139ml) o un vaso de una bebida alcohlica de alta graduacin de 1oz (42ml). Indicaciones generales  Converse con el mdico sobre maneras de Scientific laboratory technician. Puede realizar ejercicios de respiracin profunda, meditacin o yoga.  No fume ni consuma productos que contengan nicotina ni tabaco. Si necesita ayuda para dejar de fumar, consulte al mdico.  Concurra a todas las visitas de seguimiento como se lo haya indicado el mdico. Esto es importante. Comunquese con un mdico si:  Sus sntomas empeoran.  Los sntomas desaparecen y Teacher, adult education. Solicite ayuda inmediatamente si:  Vomita sangre o una sustancia parecida a los granos de [place].  La materia fecal es negra o de color rojo oscuro.  Vomita cada vez que intenta tomar lquidos.  El dolor de [place].  Tiene fiebre.  No se siente mejor luego de [employer]. Resumen  La gastritis es una hinchazn (inflamacin) del estmago.  Debe obtener ayuda para tratar esta afeccin. Si no obtiene [place], el estmago puede sangrar y pueden formarse llagas (lceras).  Esta afeccin se  diagnostica mediante los antecedentes mdicos, un examen fsico o estudios.  Puede recibir tratamiento con medicamentos para los grmenes o medicamentos para bloquear la presencia de demasiada cantidad de cido Nurse, adult. Esta informacin no tiene Marine scientist el consejo del mdico. Asegrese de hacerle al mdico cualquier pregunta que tenga. Document Released: 01/11/2012 Document Revised: 01/19/2018 Document Reviewed: 01/19/2018 Elsevier Interactive Patient Education  2019 Elsevier Inc.       Agustina Caroli, MD [facility]

## 2018-08-23 NOTE — Patient Instructions (Addendum)
Do not take NSAIDs such as Aleve, ibuprofen, Motrin.  Only take Tylenol for pain if needed.    If you have lab work done today you will be contacted with your lab results within the next 2 weeks.  If you have not heard from [place] then please contact us. The fastest way to get your results is to register for My Chart.   IF you received an x-ray today, you will receive an invoice from [facility]. Please contact [facility] at [phone] with questions or concerns regarding your invoice.   IF you received labwork today, you will receive an invoice from [place]. Please contact [facility] at [phone] with questions or concerns regarding your invoice.   Our billing staff will not be able to assist you with questions regarding bills from these companies.  You will be contacted with the lab results as soon as they are available. The fastest way to get your results is to activate your My Chart account. Instructions are located on the last page of this paperwork. If you have not heard from [place] regarding the results in 2 weeks, please contact this office.     Gastritis - Adultos Gastritis, Adult  La gastritis es una hinchazn (inflamacin) del estmago. La gastritis puede desarrollarse rpidamente (aguda). Tambin puede desarrollarse lentamente con el transcurso del tiempo (crnica). Es importante recibir ayuda para Personnel officer. Si no obtiene [place], Nurse, children's y Chief Executive Officer (lceras) en el [employer]. Cules son las causas? Esta afeccin puede ser causada por lo siguiente:  Grmenes que llegan al [employer].  Beber alcohol en exceso.  Los medicamentos que toma.  Demasiado cido en el estmago.  Una enfermedad del intestino o del estmago.  Estrs.  Una reaccin alrgica.  Enfermedad de Crohn.  Algunos tratamientos para el cncer (radiacin). A veces, se desconoce la causa de esta afeccin. Cules son los signos o los sntomas? Los  sntomas de esta afeccin incluyen los siguientes:  Research scientist (life sciences).  Una sensacin de Science writer.  Ganas de vomitar (nuseas).  Vmitos.  Sensacin de estar demasiado lleno luego de comer.  Prdida de peso.  Mal aliento.  Vomitar sangre.  Sangre en las heces. Cmo se diagnostica? Esta afeccin puede diagnosticarse en funcin de lo siguiente:  Los antecedentes mdicos y sntomas.  Un examen fsico.  Estudios. Estos pueden incluir los siguientes: ? Anlisis de [place]. ? Pruebas de heces. ? Un procedimiento para observar el interior del estmago (endoscopa alta). ? Un estudio en el cual se toma una muestra de tejido para analizarlo (biopsia). Cmo se trata? El tratamiento de esta afeccin depende de la causa. Es posible que le administren lo siguiente:  Antibiticos si la afeccin es causada por grmenes.  Bloqueadores H2 y medicamentos similares, si la afeccin fue causada por una gran cantidad de cido. Siga estas indicaciones en su casa: Medicamentos  [employer] de venta libre y los recetados solamente como se lo haya indicado el mdico.  Si le recetaron un antibitico, tmelo como se lo haya indicado el mdico. No deje de tomarlo aunque comience a sentirse mejor. Comida y bebida   Haga comidas pequeas y frecuentes en lugar de comidas abundantes.  Evite los alimentos y las bebidas que intensifican los sntomas.  Beba suficiente lquido para Consulting civil engineer orina de color amarillo plido. Consumo de alcohol  No beba alcohol si: ? El mdico le indica que no lo haga. ? Est embarazada, puede estar embarazada o est tratando  de quedar embarazada.  Si bebe alcohol: ? Limite su uso a las siguientes medidas:  De 0 a 1 medida por da para las mujeres.  De 0 a 2 medidas por da para los hombres. ? Est atento a la cantidad de alcohol que hay en las bebidas que toma. En los [place], una medida equivale a una botella de cerveza de  12oz (339ml), un vaso de vino de 5oz (159ml) o un vaso de una bebida alcohlica de alta graduacin de 1oz (60ml). Indicaciones generales  Converse con el mdico sobre maneras de Scientific laboratory technician. Puede realizar ejercicios de respiracin profunda, meditacin o yoga.  No fume ni consuma productos que contengan nicotina ni tabaco. Si necesita ayuda para dejar de fumar, consulte al mdico.  Concurra a todas las visitas de seguimiento como se lo haya indicado el mdico. Esto es importante. Comunquese con un mdico si:  Sus sntomas empeoran.  Los sntomas desaparecen y Teacher, adult education. Solicite ayuda inmediatamente si:  Vomita sangre o una sustancia parecida a los granos de [place].  La materia fecal es negra o de color rojo oscuro.  Vomita cada vez que intenta tomar lquidos.  El dolor de [place].  Tiene fiebre.  No se siente mejor luego de [employer]. Resumen  La gastritis es una hinchazn (inflamacin) del estmago.  Debe obtener ayuda para tratar esta afeccin. Si no obtiene [place], el estmago puede sangrar y pueden formarse llagas (lceras).  Esta afeccin se diagnostica mediante los antecedentes mdicos, un examen fsico o estudios.  Puede recibir tratamiento con medicamentos para los grmenes o medicamentos para bloquear la presencia de demasiada cantidad de cido Nurse, adult. Esta informacin no tiene Marine scientist el consejo del mdico. Asegrese de hacerle al mdico cualquier pregunta que tenga. Document Released: 01/11/2012 Document Revised: 01/19/2018 Document Reviewed: 01/19/2018 Elsevier Interactive Patient Education  2019 [employer].

## 2018-08-23 NOTE — Assessment & Plan Note (Signed)
Most likely acute gastritis secondary to excessive use of NSAIDs over the past 4 weeks.  Will treat with PPI and H2 blocker and follow-up in 4 weeks.

## 2018-08-24 LAB — CBC WITH DIFFERENTIAL/PLATELET
BASOS ABS: 0 10*3/uL (ref 0.0–0.2)
Basos: 1 %
EOS (ABSOLUTE): 0.1 10*3/uL (ref 0.0–0.4)
Eos: 2 %
Hematocrit: 41.4 % (ref 34.0–46.6)
Hemoglobin: 14 g/dL (ref 11.1–15.9)
Immature Grans (Abs): 0 10*3/uL (ref 0.0–0.1)
Immature Granulocytes: 0 %
LYMPHS ABS: 1.4 10*3/uL (ref 0.7–3.1)
Lymphs: 29 %
MCH: 31 pg (ref 26.6–33.0)
MCHC: 33.8 g/dL (ref 31.5–35.7)
MCV: 92 fL (ref 79–97)
Monocytes Absolute: 0.3 10*3/uL (ref 0.1–0.9)
Monocytes: 7 %
NEUTROS ABS: 3.1 10*3/uL (ref 1.4–7.0)
Neutrophils: 61 %
PLATELETS: 270 10*3/uL (ref 150–450)
RBC: 4.51 x10E6/uL (ref 3.77–5.28)
RDW: 11.9 % (ref 11.7–15.4)
WBC: 5 10*3/uL (ref 3.4–10.8)

## 2018-08-24 LAB — COMPREHENSIVE METABOLIC PANEL
ALK PHOS: 51 IU/L (ref 39–117)
ALT: 9 IU/L (ref 0–32)
AST: 19 IU/L (ref 0–40)
Albumin/Globulin Ratio: 1.5 (ref 1.2–2.2)
Albumin: 4.1 g/dL (ref 3.8–4.8)
BILIRUBIN TOTAL: 0.6 mg/dL (ref 0.0–1.2)
BUN/Creatinine Ratio: 12 (ref 9–23)
BUN: 10 mg/dL (ref 6–20)
CO2: 22 mmol/L (ref 20–29)
Calcium: 9.8 mg/dL (ref 8.7–10.2)
Chloride: 105 mmol/L (ref 96–106)
Creatinine, Ser: 0.81 mg/dL (ref 0.57–1.00)
GFR calc Af Amer: 107 mL/min/{1.73_m2} (ref >59)
GFR calc non Af Amer: 92 mL/min/{1.73_m2} (ref >59)
GLUCOSE: 87 mg/dL (ref 65–99)
Globulin, Total: 2.8 g/dL (ref 1.5–4.5)
POTASSIUM: 3.9 mmol/L (ref 3.5–5.2)
SODIUM: 140 mmol/L (ref 134–144)
Total Protein: 6.9 g/dL (ref 6.0–8.5)

## 2018-08-24 LAB — LIPASE: Lipase: 28 U/L (ref 14–72)

## 2018-08-25 ENCOUNTER — Encounter: Payer: Self-pay | Admitting: *Deleted

## 2018-09-25 ENCOUNTER — Ambulatory Visit: Payer: BLUE CROSS/BLUE SHIELD | Admitting: Emergency Medicine

## 2018-10-23 ENCOUNTER — Ambulatory Visit: Payer: BLUE CROSS/BLUE SHIELD | Admitting: Emergency Medicine

## 2018-10-27 ENCOUNTER — Other Ambulatory Visit: Payer: Self-pay | Admitting: Emergency Medicine

## 2018-10-27 DIAGNOSIS — K2900 Acute gastritis without bleeding: Secondary | ICD-10-CM

## 2018-11-21 ENCOUNTER — Other Ambulatory Visit: Payer: Self-pay | Admitting: Emergency Medicine

## 2018-11-21 DIAGNOSIS — K2900 Acute gastritis without bleeding: Secondary | ICD-10-CM

## 2018-11-22 ENCOUNTER — Other Ambulatory Visit: Payer: Self-pay | Admitting: Emergency Medicine

## 2018-11-22 DIAGNOSIS — K2900 Acute gastritis without bleeding: Secondary | ICD-10-CM

## 2018-11-22 NOTE — Telephone Encounter (Signed)
Requested medication (s) are due for refill today: yes  Requested medication (s) are on the active medication list: yes  Last refill:  10/27/18  Future visit scheduled: no  Notes to clinic:  Called pt and left message to make appt for [facility] refill already done   Requested Prescriptions  Pending Prescriptions Disp Refills   famotidine (PEPCID) 40 MG tablet [Pharmacy Med Name: FAMOTIDINE 40 MG TABLET] 90 tablet 1    Sig: TAKE 1 TABLET BY MOUTH EVERYDAY AT BEDTIME     Gastroenterology:  H2 Antagonists Failed - 11/22/2018 11:06 AM      Failed - Valid encounter within last 12 months    Recent Outpatient Visits          3 months ago Epigastric pain   [facility] [place], Ines Bloomer, MD   1 year ago Musculoskeletal pain   [facility] [place], Ines Bloomer, MD   1 year ago Muscle strain   [facility] [place], [place], MD   1 year ago Episodic tension-type headache, not intractable   [facility] at [facility], Ines Bloomer, MD

## 2018-12-05 ENCOUNTER — Telehealth: Payer: Self-pay | Admitting: *Deleted

## 2018-12-05 NOTE — Telephone Encounter (Signed)
Faxed refill request to patient's pharmacy [facility] [address] for famotidine 40 mg. Confirmation received at 5:01 pm.

## 2019-08-14 ENCOUNTER — Encounter: Payer: Self-pay | Admitting: Emergency Medicine

## 2019-08-14 ENCOUNTER — Telehealth (INDEPENDENT_AMBULATORY_CARE_PROVIDER_SITE_OTHER): Payer: BLUE CROSS/BLUE SHIELD | Admitting: Emergency Medicine

## 2019-08-14 ENCOUNTER — Other Ambulatory Visit: Payer: Self-pay

## 2019-08-14 VITALS — Ht 63.0 in | Wt 130.0 lb

## 2019-08-14 DIAGNOSIS — J0100 Acute maxillary sinusitis, unspecified: Secondary | ICD-10-CM

## 2019-08-14 DIAGNOSIS — R519 Headache, unspecified: Secondary | ICD-10-CM

## 2019-08-14 DIAGNOSIS — R0981 Nasal congestion: Secondary | ICD-10-CM

## 2019-08-14 MED ORDER — AMOXICILLIN-POT CLAVULANATE 875-125 MG PO TABS: 1.0000 | ORAL_TABLET | Freq: Two times a day (BID) | ORAL | 0 refills | Status: AC

## 2019-08-14 MED ORDER — PREDNISONE 20 MG PO TABS: 40.0000 mg | ORAL_TABLET | Freq: Every day | ORAL | 0 refills | Status: AC

## 2019-08-14 MED ORDER — TRIAMCINOLONE ACETONIDE 55 MCG/ACT NA AERO: 2.0000 | INHALATION_SPRAY | Freq: Every day | NASAL | 5 refills | Status: DC

## 2019-08-14 NOTE — Progress Notes (Signed)
Telemedicine Encounter- SOAP NOTE Established Patient  This telephone encounter was conducted with the patient's (or proxy's) verbal consent via audio telecommunications: yes/no: Yes Patient was instructed to have this encounter in a suitably private space; and to only have persons present to whom they give permission to participate. In addition, patient identity was confirmed by use of name plus two identifiers (DOB and address).  I discussed the limitations, risks, security and privacy concerns of performing an evaluation and management service by telephone and the availability of in person appointments. I also discussed with the patient that there may be a patient responsible charge related to this service. The patient expressed understanding and agreed to proceed.  I spent a total of TIME; 0 MIN TO 60 MIN: 15 minutes talking with the patient or their proxy.  Chief Complaint  Patient presents with  . Headache    with nasal congestion and eyes hurt all sxs started 4 months ago  . Chills    diarrhea 4 times day before yesterday, patient has not been tested for COVID, pt advised to call her pharmacy to schedule COVID test    Subjective   Kaitlin Suarez is a 39 y.o. female established patient. Telephone visit today complaining of allergy symptoms for the past 4 months.  Intermittent symptoms with itchy nose, sinus headache, sneezing and occasional fever and chills.  Also had mild diarrhea the past several days but is better now.  Denies any other associated symptoms.  Has not been tested for Covid.  HPI   There are no problems to display for this patient.   History reviewed. No pertinent past medical history.  Current Outpatient Medications  Medication Sig Dispense Refill  . Acetaminophen (TYLENOL PO) Take by mouth as needed.    Marland Kitchen amoxicillin-clavulanate (AUGMENTIN) 875-125 MG tablet Take 1 tablet by mouth 2 (two) times daily for 7 days. 14 tablet 0  . cyclobenzaprine (FLEXERIL)  10 MG tablet Take 1 tablet (10 mg total) by mouth 3 (three) times daily as needed for muscle spasms. (Patient not taking: Reported on 08/14/2019) 30 tablet 0  . diclofenac (VOLTAREN) 75 MG EC tablet TAKE 1 TABLET BY MOUTH TWICE A DAY (Patient not taking: Reported on 08/14/2019) 30 tablet 0  . famotidine (PEPCID) 40 MG tablet TAKE 1 TABLET BY MOUTH EVERYDAY AT BEDTIME (Patient not taking: Reported on 08/14/2019) 30 tablet 0  . Naproxen Sodium (ALEVE PO) Take by mouth daily.    Marland Kitchen omeprazole (PRILOSEC) 40 MG capsule Take 1 capsule (40 mg total) by mouth daily. (Patient not taking: Reported on 08/14/2019) 30 capsule 3  . predniSONE (DELTASONE) 20 MG tablet Take 2 tablets (40 mg total) by mouth daily with breakfast for 5 days. 10 tablet 0  . traMADol (ULTRAM) 50 MG tablet Take 1 tablet (50 mg total) by mouth every 8 (eight) hours as needed. (Patient not taking: Reported on 08/14/2019) 20 tablet 0  . triamcinolone (NASACORT) 55 MCG/ACT AERO nasal inhaler Place 2 sprays into the nose daily. 1 Inhaler 5   No current facility-administered medications for this visit.    No Known Allergies  Social History   Socioeconomic History  . Marital status: Married    Spouse name: Not on file  . Number of children: Not on file  . Years of education: Not on file  . Highest education level: Not on file  Occupational History  . Not on file  Tobacco Use  . Smoking status: Never Smoker  . Smokeless tobacco: Never  Used  Substance and Sexual Activity  . Alcohol use: Not on file  . Drug use: Not on file  . Sexual activity: Not on file  Other Topics Concern  . Not on file  Social History Narrative  . Not on file   Social Determinants of Health   Financial Resource Strain:   . Difficulty of Paying Living Expenses: Not on file  Food Insecurity:   . Worried About Charity fundraiser in the Last Year: Not on file  . Ran Out of Food in the Last Year: Not on file  Transportation Needs:   . Lack of Transportation  (Medical): Not on file  . Lack of Transportation (Non-Medical): Not on file  Physical Activity:   . Days of Exercise per Week: Not on file  . Minutes of Exercise per Session: Not on file  Stress:   . Feeling of Stress : Not on file  Social Connections:   . Frequency of Communication with Friends and Family: Not on file  . Frequency of Social Gatherings with Friends and Family: Not on file  . Attends Religious Services: Not on file  . Active Member of Clubs or Organizations: Not on file  . Attends Archivist Meetings: Not on file  . Marital Status: Not on file  Intimate Partner Violence:   . Fear of Current or Ex-Partner: Not on file  . Emotionally Abused: Not on file  . Physically Abused: Not on file  . Sexually Abused: Not on file    Review of Systems  Constitutional: Positive for chills and fever. Negative for malaise/fatigue.  HENT: Positive for congestion and sinus pain. Negative for sore throat.   Respiratory: Negative.  Negative for cough and shortness of breath.   Cardiovascular: Negative.  Negative for chest pain and palpitations.  Gastrointestinal: Positive for diarrhea. Negative for abdominal pain, nausea and vomiting.  Genitourinary: Negative.  Negative for dysuria and hematuria.  Musculoskeletal: Negative.   Skin: Negative.  Negative for rash.  Neurological: Positive for headaches.  Endo/Heme/Allergies: Negative.   All other systems reviewed and are negative.   Objective  Alert and oriented x3 in no apparent respiratory distress. Vitals as reported by the patient: Today's Vitals   08/14/19 1654  Weight: 130 lb (59 kg)  Height: 5\' 3"  (1.6 m)    [name] was seen today for headache and chills.  Diagnoses and all orders for this visit:  Sinus congestion -     predniSONE (DELTASONE) 20 MG tablet; Take 2 tablets (40 mg total) by mouth daily with breakfast for 5 days. -     triamcinolone (NASACORT) 55 MCG/ACT AERO nasal inhaler; Place 2 sprays into the  nose daily.  Sinus headache  Acute non-recurrent maxillary sinusitis -     amoxicillin-clavulanate (AUGMENTIN) 875-125 MG tablet; Take 1 tablet by mouth 2 (two) times daily for 7 days.  Clinically stable.  No red flag signs or symptoms.  Advised to get tested for Covid infection. Covid precautions given.   I discussed the assessment and treatment plan with the patient. The patient was provided an opportunity to ask questions and all were answered. The patient agreed with the plan and demonstrated an understanding of the instructions.   The patient was advised to call back or seek an in-person evaluation if the symptoms worsen or if the condition fails to improve as anticipated.  I provided 15 minutes of non-face-to-face time during this encounter.  Horald Pollen, MD  Primary Care at [facility]

## 2019-11-20 ENCOUNTER — Telehealth: Payer: Self-pay

## 2019-11-20 DIAGNOSIS — R1013 Epigastric pain: Secondary | ICD-10-CM

## 2019-11-20 NOTE — Telephone Encounter (Signed)
Pt would like referral to a Spanish speaking Gastroenterologists office

## 2019-11-28 NOTE — Telephone Encounter (Signed)
Pt checking on status of this message. She is still wanting a referral. Please advise at [phone].

## 2019-11-28 NOTE — Addendum Note (Signed)
Addended by: Anastasio Auerbach R on: 11/28/2019 12:29 PM   Modules accepted: Orders

## 2020-05-15 ENCOUNTER — Encounter: Payer: Self-pay | Admitting: Physician Assistant

## 2020-06-03 ENCOUNTER — Ambulatory Visit: Payer: No Typology Code available for payment source | Admitting: Physician Assistant

## 2020-06-03 ENCOUNTER — Encounter: Payer: Self-pay | Admitting: Physician Assistant

## 2020-06-03 ENCOUNTER — Other Ambulatory Visit (INDEPENDENT_AMBULATORY_CARE_PROVIDER_SITE_OTHER): Payer: No Typology Code available for payment source

## 2020-06-03 VITALS — BP 122/78 | HR 77 | Ht 63.0 in | Wt 142.0 lb

## 2020-06-03 DIAGNOSIS — R109 Unspecified abdominal pain: Secondary | ICD-10-CM

## 2020-06-03 DIAGNOSIS — R1030 Lower abdominal pain, unspecified: Secondary | ICD-10-CM | POA: Diagnosis not present

## 2020-06-03 DIAGNOSIS — R197 Diarrhea, unspecified: Secondary | ICD-10-CM | POA: Diagnosis not present

## 2020-06-03 LAB — C-REACTIVE PROTEIN: CRP: <1 mg/dL (ref 0.5–20.0)

## 2020-06-03 LAB — CBC WITH DIFFERENTIAL/PLATELET
Basophils Absolute: 0 10*3/uL (ref 0.0–0.1)
Basophils Relative: 0.4 % (ref 0.0–3.0)
Eosinophils Absolute: 0.1 10*3/uL (ref 0.0–0.7)
Eosinophils Relative: 1.4 % (ref 0.0–5.0)
HCT: 41.8 % (ref 36.0–46.0)
Hemoglobin: 14.1 g/dL (ref 12.0–15.0)
Lymphocytes Relative: 24.3 % (ref 12.0–46.0)
Lymphs Abs: 1.9 10*3/uL (ref 0.7–4.0)
MCHC: 33.8 g/dL (ref 30.0–36.0)
MCV: 90.5 fl (ref 78.0–100.0)
Monocytes Absolute: 0.5 10*3/uL (ref 0.1–1.0)
Monocytes Relative: 6.3 % (ref 3.0–12.0)
Neutro Abs: 5.2 10*3/uL (ref 1.4–7.7)
Neutrophils Relative %: 67.6 % (ref 43.0–77.0)
Platelets: 266 10*3/uL (ref 150.0–400.0)
RBC: 4.62 Mil/uL (ref 3.87–5.11)
RDW: 12.5 % (ref 11.5–15.5)
WBC: 7.7 10*3/uL (ref 4.0–10.5)

## 2020-06-03 LAB — BASIC METABOLIC PANEL
BUN: 8 mg/dL (ref 6–23)
CO2: 28 mEq/L (ref 19–32)
Calcium: 9.1 mg/dL (ref 8.4–10.5)
Chloride: 104 mEq/L (ref 96–112)
Creatinine, Ser: 0.76 mg/dL (ref 0.40–1.20)
GFR: 98.16 mL/min (ref >60.00)
Glucose, Bld: 90 mg/dL (ref 70–99)
Potassium: 3.4 mEq/L — ABNORMAL LOW (ref 3.5–5.1)
Sodium: 138 mEq/L (ref 135–145)

## 2020-06-03 LAB — SEDIMENTATION RATE: Sed Rate: 3 mm/hr (ref 0–20)

## 2020-06-03 MED ORDER — GLYCOPYRROLATE 2 MG PO TABS: ORAL_TABLET | ORAL | 6 refills | Status: DC

## 2020-06-03 MED ORDER — NA SULFATE-K SULFATE-MG SULF 17.5-3.13-1.6 GM/177ML PO SOLN: 1.0000 | Freq: Once | ORAL | 0 refills | Status: AC

## 2020-06-03 NOTE — Patient Instructions (Signed)
If you are age 40 or older, your body mass index should be between 23-30. Your Body mass index is 25.15 kg/m. If this is out of the aforementioned range listed, please consider follow up with your Primary Care Provider.  If you are age 7 or younger, your body mass index should be between 19-25. Your Body mass index is 25.15 kg/m. If this is out of the aformentioned range listed, please consider follow up with your Primary Care Provider.   You have been scheduled for a colonoscopy. Please follow written instructions given to you at your visit today.  Please pick up your prep supplies at the pharmacy within the next 1-3 days. If you use inhalers (even only as needed), please bring them with you on the day of your procedure.  Your provider has requested that you go to the basement level for lab work before leaving today. Press "B" on the elevator. The lab is located at the first door on the left as you exit the elevator.  START Glycopyrrolate 2 mg 1 tablet before meals at breakfast and dinner.  Avoid Lactose and start a Lactose free diet.  Follow up pending las and colonoscopy.   Dieta sin lactosa, en adultos Lactose-Free Diet, Adult Si tiene intolerancia a la lactosa, no puede Manufacturing systems engineer. La lactosa es el azcar natural que se encuentra principalmente en la [place] de origen animal y los productos lcteos. Es posible que Product/process development scientist todos los alimentos y las bebidas que contengan lactosa. Para lograr esto, una dieta sin lactosa puede ayudarlo. Qu alimentos contienen lactosa? La lactosa se encuentra en la [place] de origen animal y en los productos lcteos, como los siguientes:  Yogur.  Queso.  [place].  Margarina.  Phoebe Sharps.  Crema.  Coberturas batidas y sustitutos de crema.  Helados y otros postres a base de lcteos. Los alimentos o los productos elaborados con leche de origen animal o ingredientes lcteos tambin contienen [place]. Para averiguar si un  alimento contiene leche de origen animal o un ingrediente lcteo, consulte la lista de ingredientes. Evite los alimentos con la leyenda "Puede contener [employer]" y los alimentos que contengan:  Leche en polvo.  Suero de DISH.  Cuajada.  [place].  Lactosa.  Lactalbmina.  Lactoglobulina. Cules son las alternativas para la [place] de origen animal y los alimentos elaborados con productos lcteos?  Leche sin lactosa.  Leche de soja con agregado de calcio y vitamina D.  Leche de [place], de coco o de arroz, u otras alternativas de leches que no son de origen animal con agregado de calcio y vitamina D. Tenga en cuenta que estos productos tienen bajo contenido de protenas.  Productos de soja, como yogur de [place], queso de soja, helado de soja y crema agria a base de soja.  Otros productos lcteos de frutos secos, como yogur de [place], queso de [place], yogur de [place] de caj, queso de castaas de caj, helado de castaas de caj, yogur de coco y helado de coco. Cules son algunos consejos para seguir este plan?  No consuma alimentos, bebidas, vitaminas, minerales ni medicamentos que contengan lactosa. Lea atentamente las listas de ingredientes.  Busque el trmino "sin lactosa" en las etiquetas.  Use gotas o comprimidos de enzima Chartered loss adjuster se lo haya indicado el mdico.  Use leche sin lactosa o un producto alternativo para la [place], como [place] de soja o de [place], para beber y Audiological scientist.  Consuma suficiente calcio y vitaminaD en la dieta. Un plan de alimentacin  sin lactosa puede carecer de estos nutrientes importantes.  Tome los suplementos de calcio y vitaminaD como se lo haya indicado el mdico. Hable con el mdico sobre los suplementos, si no puede obtener suficiente calcio y vitaminaD de los alimentos. Qu alimentos puedo comer?  Frutas Todas las frutas frescas, en lata, congeladas o secas que no estn procesadas con lactosa. Verduras Todas las verduras  frescas, congeladas y en lata sin queso, crema ni salsas de [place]. Cereales [place] no elaborado con leche de origen animal o productos lcteos. Carnes y otras protenas Cualquier tipo de carne, pescado, ave de corral, y otras fuentes de protenas no elaborados con Air traffic controller de origen animal o productos lcteos. Queso y yogur de soja. Grasas y aceites [place]iera no elaborado con [place] de origen animal o productos lcteos. Bebidas Leche sin lactosa. Leche de soja, arroz o almendras con agregado de calcio y vitamina D. Jugos de frutas y verduras. Dulces y postres C[place]quiera no elaborado con [place] de origen animal o productos lcteos. Alios y condimentos C[place]quiera no elaborado con [place] de origen animal o productos lcteos. Calcio Muchos alimentos con lactosa contienen calcio, que es importante para la salud sea. La cantidad de calcio que necesita depende de su edad:  Adultos menores de 50aos de edad: 1000mg  de Systems analyst.  Adultos mayores de 50aos de edad: 1200mg  de calcio por da. Si no consume la cantidad suficiente de calcio, puede recurrir a otras fuentes de calcio, como por ejemplo:  Jugo de naranja con agregado de calcio. Una taza de jugo de naranja contiene entre 300mg  y 350mg  de calcio.  Leche de soja fortificada con calcio. Una taza de [place] de soja fortificada con calcio contiene entre 300mg  y 400mg  de calcio.  Leche de almendras o de arroz fortificada con calcio. Una taza de [place] de almendras o de arroz fortificada con calcio contiene 300mg  de calcio.  Cereales fortificados con calcio para el desayuno. Los cereales fortificados con calcio para el desayuno contienen entre 100mg  y 1000mg  de calcio.  [place] cocida. Media taza de espinaca cocida contiene 145mg  de calcio.  Vainas de soja cocidas. Media taza de vainas de soja cocidas contiene 130mg  de calcio.  Coles berzas cocidas. Media taza de coles berzas cocidas contiene 125mg  de calcio.  Col  rizada congelada o cocida. Media taza de col rizada congelada o cocina contiene 90mg  de calcio.  Almendras. Un cuarto de taza de almendras contiene 95mg  de calcio.  Brcoli cocido. Una taza de brcoli cocido contiene 60mg  de calcio. Es posible que los productos mencionados arriba no formen una lista completa de las bebidas o los alimentos recomendados. Comunquese con un nutricionista para conocer ms opciones. Qu alimentos no se recomiendan? Frutas [place], excepto que estn elaboradas con [place] de origen animal o productos lcteos. Verduras [place], excepto que estn elaboradas con [place] de origen animal o productos lcteos. Cereales [place] elaborado con leche de origen animal o productos lcteos. Carnes y otras protenas [place], excepto que estn elaboradas con [place] de origen animal o productos lcteos. [employer], entre ellos, [place], [place], [place], kefir, [place], [place] saborizada, [place], [place] condensada, [place] de [place], [place], Estate agent, Marisue Humble y quesos procesados. Grasas y aceites C[place]quiera elaborado con [place] o productos lcteos. Margarinas y aderezos para ensaladas que contengan [place] o [place]. Crema. Mitad leche y Haematologist. Queso crema. Phoebe Sharps. Salsas para untar elaboradas con crema agria o yogur. Bebidas Chocolate caliente. [place] con lactosa. Ts helados instantneos.  Bebidas frutales en polvo. Batidos preparados con [employer] de origen Social research officer, government. Dulces y postres Cualquiera elaborado con [place] o productos lcteos. Alios y condimentos [place] de Higher education careers adviser que [place]. Mezclas de especias si contienen lactosa. Edulcorantes artificiales que TEFL teacher. Sustitutos de cremas no lcteas. Es posible que los productos que se enumeran ms arriba no sean una lista completa de los alimentos y las bebidas que se Higher education careers adviser. Comunquese con un nutricionista para obtener ms informacin. Resumen  Si  tiene intolerancia a Chief of Staff, significa que tiene dificultad para digerir la lactosa, un azcar natural que se halla en la [place] y los productos lcteos.  Seguir una dieta sin lactosa puede ayudarlo a Administrator, sports.  El calcio es importante para la salud de los huesos y se Firefighter en muchos alimentos que contienen [place]. Hable con el mdico sobre otras fuentes de calcio. Esta informacin no tiene Marine scientist el consejo del mdico. Asegrese de hacerle al mdico cualquier pregunta que tenga. Document Revised: 09/16/2017 Document Reviewed: 09/16/2017 Elsevier Patient Education  2020 [employer].

## 2020-06-04 ENCOUNTER — Encounter: Payer: Self-pay | Admitting: Physician Assistant

## 2020-06-04 LAB — TISSUE TRANSGLUTAMINASE, IGA: (tTG) Ab, IgA: <1 U/mL

## 2020-06-04 LAB — IGA: Immunoglobulin A: 132 mg/dL (ref 47–310)

## 2020-06-04 NOTE — Progress Notes (Signed)
Subjective:    Patient ID: Kaitlin Suarez, female    DOB: 12-02-1980, 39 y.o.   MRN: [id number]  HPI Kaitlin Suarez is a pleasant 39-year-old non-English-speaking Hispanic female, new to GI today referred by Agustina Caroli MD for evaluation of abdominal pain. Interview was done today with mobile interpreter.  She has not had any prior GI evaluation, and no GI imaging. She says her current symptoms have been present for many years and remembers having similar though not as significant symptoms as a teenager.  She is complaining of rather generalized abdominal discomfort and postprandial diarrhea.  She had been given a trial of famotidine which has not been beneficial.  She feels that her symptoms have progressed over the past year.  She says initially she would feel the pain mostly on her left side and then across her lower abdomen and has now become more generalized at times.  She says it is constant and worse after each meal.  This is associated with abdominal bloating some distention and diarrhea at least 3-4 times per day.  She is not having any nocturnal episodes of diarrhea, no melena or hematochezia. She feels that she has diarrhea no matter what she eats though feels that lactose and beans seem to aggravate her symptoms as do several other foods.  She is not really having any regular bowel movements.  Appetite has been okay weight has been stable. No use of artificial sweeteners, has been trying to limit lactose. Family history is negative for GI disease as far she is aware. Only prior abdominal surgery has been C-section x2.  Review of Systems Pertinent positive and negative review of systems were noted in the above HPI section.  All other review of systems was otherwise negative.  Outpatient Encounter Medications as of 06/03/2020  Medication Sig  . glycopyrrolate (ROBINUL) 2 MG tablet Take 1 tablet before meals at breakfast and dinner.  . [EXPIRED] Na Sulfate-K Sulfate-Mg Sulf 17.5-3.13-1.6  GM/177ML SOLN Take 1 kit by mouth once for 1 dose. Apply Coupon=BIN: [id number] PCN: CN GROUP: [id number] ID: [id number]  . [DISCONTINUED] Acetaminophen (TYLENOL PO) Take by mouth as needed.  . [DISCONTINUED] cyclobenzaprine (FLEXERIL) 10 MG tablet Take 1 tablet (10 mg total) by mouth 3 (three) times daily as needed for muscle spasms. (Patient not taking: Reported on 08/14/2019)  . [DISCONTINUED] diclofenac (VOLTAREN) 75 MG EC tablet TAKE 1 TABLET BY MOUTH TWICE A DAY (Patient not taking: Reported on 08/14/2019)  . [DISCONTINUED] famotidine (PEPCID) 40 MG tablet TAKE 1 TABLET BY MOUTH EVERYDAY AT BEDTIME (Patient not taking: Reported on 08/14/2019)  . [DISCONTINUED] Naproxen Sodium (ALEVE PO) Take by mouth daily.  . [DISCONTINUED] omeprazole (PRILOSEC) 40 MG capsule Take 1 capsule (40 mg total) by mouth daily. (Patient not taking: Reported on 08/14/2019)  . [DISCONTINUED] traMADol (ULTRAM) 50 MG tablet Take 1 tablet (50 mg total) by mouth every 8 (eight) hours as needed. (Patient not taking: Reported on 08/14/2019)  . [DISCONTINUED] triamcinolone (NASACORT) 55 MCG/ACT AERO nasal inhaler Place 2 sprays into the nose daily.   No facility-administered encounter medications on file as of 06/03/2020.   No Known Allergies There are no problems to display for this patient.  Social History   Socioeconomic History  . Marital status: Married    Spouse name: Not on file  . Number of children: Not on file  . Years of education: Not on file  . Highest education level: Not on file  Occupational History  . Not on file  Tobacco  Use  . Smoking status: Never Smoker  . Smokeless tobacco: Never Used  Vaping Use  . Vaping Use: Never used  Substance and Sexual Activity  . Alcohol use: Never  . Drug use: Never  . Sexual activity: Yes    Partners: Female  Other Topics Concern  . Not on file  Social History Narrative  . Not on file   Social Determinants of Health   Financial Resource Strain:   . Difficulty  of Paying Living Expenses: Not on file  Food Insecurity:   . Worried About Charity fundraiser in the Last Year: Not on file  . Ran Out of Food in the Last Year: Not on file  Transportation Needs:   . Lack of Transportation (Medical): Not on file  . Lack of Transportation (Non-Medical): Not on file  Physical Activity:   . Days of Exercise per Week: Not on file  . Minutes of Exercise per Session: Not on file  Stress:   . Feeling of Stress : Not on file  Social Connections:   . Frequency of Communication with Friends and Family: Not on file  . Frequency of Social Gatherings with Friends and Family: Not on file  . Attends Religious Services: Not on file  . Active Member of Clubs or Organizations: Not on file  . Attends Archivist Meetings: Not on file  . Marital Status: Not on file  Intimate Partner Violence:   . Fear of Current or Ex-Partner: Not on file  . Emotionally Abused: Not on file  . Physically Abused: Not on file  . Sexually Abused: Not on file    Ms. Kaitlin Suarez's family history includes Hypertension in her father and mother.      Objective:    Vitals:   06/03/20 1329  BP: 122/78  Pulse: 77    Physical Exam Well-developed well-nourished Hispanic female in no acute distress.  Height, Weight, 142 accompanied by her husband  HEENT; nontraumatic normocephalic, EOMI, PER R LA, sclera anicteric. Oropharynx; not examined Neck; supple, no JVD Cardiovascular; regular rate and rhythm with S1-S2, no murmur rub or gallop Pulmonary; Clear bilaterally Abdomen; soft, mild bilateral lower quadrant tenderness no guarding, nondistended, no palpable mass or hepatosplenomegaly, bowel sounds are active Rectal; not done today Skin; benign exam, no jaundice rash or appreciable lesions Extremities; no clubbing cyanosis or edema skin warm and dry Neuro/Psych; alert and oriented x4, grossly nonfocal mood and affect appropriate       Assessment & Plan:   #11 39 year old  Hispanic non-English speaking female with several year history of lower abdominal discomfort worsened postprandially and postprandial diarrhea with progression of symptoms over the past year now constant daily and associated with abdominal bloating and distention at times.  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 As symptoms have progressed over the past year or so we will need to rule out underlying IBD, rule out celiac disease, doubt neoplasm but cannot rule out.  #2 status post C-section x2  Plan; CBC with differential, c-Met, sed rate, CRP, TTG and IgA. Patient advised to follow a lactose-free diet Trial of glycopyrrolate 2 mg p.o. twice daily as needed, advised to start with 1 p.o. every morning Patient will be scheduled for colonoscopy with Dr. Ardis Hughs.  Procedure was discussed in detail with patient including indications risks and benefits and she is agreeable to proceed.  She has completed COVID-19 vaccination. Patient will need an  interpreter on the day of procedure.  Orley Lawry S Avon Mergenthaler PA-C 06/04/2020   Cc: [place], [place]

## 2020-06-05 NOTE — Progress Notes (Signed)
I agree with the above note, plan 

## 2020-06-11 ENCOUNTER — Encounter: Payer: Self-pay | Admitting: Gastroenterology

## 2020-06-11 ENCOUNTER — Other Ambulatory Visit: Payer: Self-pay

## 2020-06-11 ENCOUNTER — Ambulatory Visit (AMBULATORY_SURGERY_CENTER): Payer: No Typology Code available for payment source | Admitting: Gastroenterology

## 2020-06-11 VITALS — BP 119/75 | HR 81 | Temp 97.1°F | Resp 10 | Ht 63.0 in | Wt 142.0 lb

## 2020-06-11 DIAGNOSIS — K621 Rectal polyp: Secondary | ICD-10-CM

## 2020-06-11 DIAGNOSIS — D128 Benign neoplasm of rectum: Secondary | ICD-10-CM

## 2020-06-11 DIAGNOSIS — K529 Noninfective gastroenteritis and colitis, unspecified: Secondary | ICD-10-CM

## 2020-06-11 DIAGNOSIS — R109 Unspecified abdominal pain: Secondary | ICD-10-CM

## 2020-06-11 MED ORDER — SODIUM CHLORIDE 0.9 % IV SOLN: 500.0000 mL | Freq: Once | INTRAVENOUS | Status: DC

## 2020-06-11 NOTE — Progress Notes (Signed)
VS by CW   Pt's states no medical or surgical changes since previsit or office visit.   Interpreter: Gypsy Balsam

## 2020-06-11 NOTE — Progress Notes (Signed)
Interpreter Cresenciano Genre present in recovery.

## 2020-06-11 NOTE — Op Note (Signed)
[place] Patient Name: Kaitlin Suarez Procedure Date: 06/11/2020 10:31 AM MRN: [id number] Endoscopist: Milus Banister , MD Age: 39 Referring MD:  Date of Birth: 04-08-1981 Gender: Female Account #: [id number] Procedure:                Colonoscopy Indications:              Chronic loose stools Medicines:                Monitored Anesthesia Care Procedure:                Pre-Anesthesia Assessment:                           - Prior to the procedure, a History and Physical                            was performed, and patient medications and                            allergies were reviewed. The patient's tolerance of                            previous anesthesia was also reviewed. The risks                            and benefits of the procedure and the sedation                            options and risks were discussed with the patient.                            All questions were answered, and informed consent                            was obtained. Prior Anticoagulants: The patient has                            taken no previous anticoagulant or antiplatelet                            agents. ASA Grade Assessment: II - A patient with                            mild systemic disease. After reviewing the risks                            and benefits, the patient was deemed in                            satisfactory condition to undergo the procedure.                           After obtaining informed consent, the colonoscope  was passed under direct vision. Throughout the                            procedure, the patient's blood pressure, pulse, and                            oxygen saturations were monitored continuously. The                            Colonoscope was introduced through the anus and                            advanced to the the terminal ileum. The colonoscopy                            was performed without difficulty.  The patient                            tolerated the procedure well. The quality of the                            bowel preparation was good. The terminal ileum,                            ileocecal valve, appendiceal orifice, and rectum                            were photographed. Scope In: 10:36:14 AM Scope Out: 10:45:53 AM Scope Withdrawal Time: 0 hours 7 minutes 8 seconds  Total Procedure Duration: 0 hours 9 minutes 39 seconds  Findings:                 The terminal ileum appeared normal.                           A 3 mm polyp was found in the rectum. The polyp was                            sessile. The polyp was removed with a cold snare.                            Resection and retrieval were complete.                           Biopsies for histology were taken with a cold                            forceps from the entire colon for evaluation of                            microscopic colitis.                           The exam was otherwise without abnormality on  direct and retroflexion views. Complications:            No immediate complications. Estimated blood loss:                            None. Estimated Blood Loss:     Estimated blood loss: none. Impression:               - The examined portion of the ileum was normal.                           - One 3 mm polyp in the rectum, removed with a cold                            snare. Resected and retrieved.                           - The examination was otherwise normal on direct                            and retroflexion views.                           - Biopsies were taken with a cold forceps from the                            entire colon for evaluation of microscopic colitis. Recommendation:           - Patient has a contact number available for                            emergencies. The signs and symptoms of potential                            delayed complications were discussed with  the                            patient. Return to normal activities tomorrow.                            Written discharge instructions were provided to the                            patient.                           - Resume previous diet.                           - Continue present medications. The robinul                            (glycopyrrolate) seems to be helping, you should                            continue taking it as needed twice daily.                           -  Await pathology results. Milus Banister, MD 06/11/2020 10:49:56 AM This report has been signed electronically.

## 2020-06-11 NOTE — Progress Notes (Signed)
Report given to PACU, vss 

## 2020-06-11 NOTE — Patient Instructions (Addendum)
Handout on polyps given.   LO QUE PUEDE ESPERAR: Algunas sensaciones de hinchazn en el abdomen.  Puede tener ms gases de lo normal.  El caminar puede ayudarle a eliminar el aire que se le puso en el tracto gastrointestinal durante el procedimiento y reducir la hinchazn.  Si le hicieron una endoscopia inferior (como una colonoscopia o una sigmoidoscopia flexible), podra notar manchas de sangre en las heces fecales o en el papel higinico.  Si se someti a una preparacin intestinal para su procedimiento, es posible que no tenga una evacuacin intestinal normal durante [employer].   Tenga en cuenta:  Es posible que note un poco de irritacin y congestin en la nariz o algn drenaje.  Esto es debido al oxgeno [employer] durante su procedimiento.  No hay que preocuparse y esto debe desaparecer ms o Scientist, research (medical).   SNTOMAS PARA REPORTAR INMEDIATAMENTE:  Despus de una endoscopia inferior (colonoscopia o sigmoidoscopia flexible):  Cantidades excesivas de sangre en las heces fecales  Sensibilidad significativa o empeoramiento de los dolores abdominales   Hinchazn aguda del abdomen que antes no tena   Fiebre de 100F o ms     Para asuntos urgentes o de Freight forwarder, puede comunicarse con un gastroenterlogo a cualquier hora llamando al [phone].  DIETA:  Recomendamos una comida pequea al principio, pero luego puede continuar con su dieta normal.  Tome muchos lquidos, Teacher, adult education las bebidas alcohlicas durante 24 horas.    ACTIVIDAD:  Debe planear tomarse las cosas con calma por el resto del da y no debe CONDUCIR ni usar maquinaria pesada Programmer, applications (debido a los medicamentos de sedacin utilizados durante el examen).     SEGUIMIENTO: Nuestro personal llamar al nmero que aparece en su historial al siguiente da hbil de su procedimiento para ver cmo se siente y para responder cualquier pregunta o inquietud que pueda tener con respecto a la informacin que se le dio despus  del procedimiento. Si no podemos contactarle, le dejaremos un mensaje.  Sin embargo, si se siente bien y no tiene Paediatric nurse, no es necesario que nos devuelva la llamada.  Asumiremos que ha regresado a sus actividades diarias normales sin incidentes. Si se le tomaron algunas biopsias, le contactaremos por telfono o por carta en las prximas 3 semanas.  Si no ha sabido [employer] biopsias en el transcurso de 3 semanas, por favor llmenos al [phone].   FIRMAS/CONFIDENCIALIDAD: Usted y/o el acompaante que le cuide han firmado documentos que se ingresarn en su historial mdico electrnico.  Estas firmas atestiguan el hecho de que la informacin anterior

## 2020-06-13 ENCOUNTER — Telehealth: Payer: Self-pay

## 2020-06-13 NOTE — Telephone Encounter (Signed)
First attempt follow up call to pt, lm on vm 

## 2020-06-13 NOTE — Telephone Encounter (Signed)
Spoke with husband.  He states she is still having abdominal pain, but she is not with him to describe it or tell us if it is worsening.  I explained it may take a few days to get her energy back after not eating and prepping for the procedure.  Understanding voiced.  I told him to call the on call MD over the weekend if needed.

## 2020-06-13 NOTE — Telephone Encounter (Signed)
Pt's spouse is requesting a call back from a nurse to discuss her symptoms she has been experiencing since her colonoscopy done 11/17. Pt is experiencing pain on both side of her stomach when she walks and feels fatigued.

## 2020-06-13 NOTE — Telephone Encounter (Signed)
  Follow up Call-  Call back number 06/11/2020  Post procedure Call Back phone  # [phone]- husband  Permission to leave phone message Yes  Some recent data might be hidden     Left message

## 2020-06-18 ENCOUNTER — Encounter: Payer: Self-pay | Admitting: Gastroenterology

## 2020-07-03 ENCOUNTER — Other Ambulatory Visit: Payer: Self-pay | Admitting: Emergency Medicine

## 2020-07-03 DIAGNOSIS — K2900 Acute gastritis without bleeding: Secondary | ICD-10-CM

## 2020-07-03 NOTE — Telephone Encounter (Signed)
Requested medication (s) are due for refill today: no  Requested medication (s) are on the active medication list: no  Last refill:  Discontinued on 06/03/20  Future visit scheduled: no  Notes to clinic:  Please review for refill. Medication not on current med list. Discontinued on 06/03/20    Requested Prescriptions  Pending Prescriptions Disp Refills   omeprazole (PRILOSEC) 40 MG capsule [Pharmacy Med Name: OMEPRAZOLE DR 40 MG CAPSULE] 30 capsule 3    Sig: TAKE [place]      Gastroenterology: Proton Pump Inhibitors Passed - 07/03/2020  2:27 PM      Passed - Valid encounter within last 12 months    Recent Outpatient Visits           10 months ago Sinus congestion   [facility] at [facility], Ines Bloomer, MD   1 year ago Epigastric pain   [facility] at [place], Ines Bloomer, MD   3 years ago Musculoskeletal pain   [facility] at [place], Ines Bloomer, MD   3 years ago Muscle strain   [facility] at [place], [place], MD   3 years ago Episodic tension-type headache, not intractable   [facility] Care at [facility], Ines Bloomer, MD

## 2020-07-03 NOTE — Telephone Encounter (Signed)
No further refills without office visit 

## 2020-07-30 ENCOUNTER — Other Ambulatory Visit: Payer: Self-pay | Admitting: Emergency Medicine

## 2020-07-30 DIAGNOSIS — K2900 Acute gastritis without bleeding: Secondary | ICD-10-CM

## 2020-08-05 ENCOUNTER — Other Ambulatory Visit: Payer: Self-pay | Admitting: Emergency Medicine

## 2020-08-05 DIAGNOSIS — K2900 Acute gastritis without bleeding: Secondary | ICD-10-CM

## 2020-08-05 MED ORDER — OMEPRAZOLE 40 MG PO CPDR: DELAYED_RELEASE_CAPSULE | ORAL | 0 refills | Status: DC

## 2020-08-05 NOTE — Telephone Encounter (Signed)
Call to husband- left message on VM to have wife call office to schedule annual physical - will increase #90- but needs to schedule appointment for continuation of medications.

## 2020-08-05 NOTE — Telephone Encounter (Signed)
Medication Refill - Medication: omeprazole (PRILOSEC) 40 MG capsule   Pt's husband called back requesting a 90 day supply   Has the patient contacted their pharmacy? Yes.   (Agent: If no, request that the patient contact the pharmacy for the refill.) (Agent: If yes, when and what did the pharmacy advise?)  Preferred Pharmacy (with phone number or street name):  [id number] - [place], [place]  [address] [place] [address]  Phone: [phone] Fax: [phone]     Agent: Please be advised that RX refills may take up to 3 business days. We ask that you follow-up with your pharmacy.

## 2020-10-28 ENCOUNTER — Other Ambulatory Visit: Payer: Self-pay | Admitting: Emergency Medicine

## 2020-10-28 DIAGNOSIS — K2900 Acute gastritis without bleeding: Secondary | ICD-10-CM

## 2020-11-04 ENCOUNTER — Encounter: Payer: Self-pay | Admitting: Emergency Medicine

## 2020-11-24 ENCOUNTER — Other Ambulatory Visit: Payer: Self-pay

## 2020-11-24 DIAGNOSIS — I83893 Varicose veins of bilateral lower extremities with other complications: Secondary | ICD-10-CM

## 2020-12-04 ENCOUNTER — Encounter: Payer: Self-pay | Admitting: Physician Assistant

## 2020-12-04 ENCOUNTER — Ambulatory Visit (HOSPITAL_COMMUNITY)
Admission: RE | Admit: 2020-12-04 | Discharge: 2020-12-04 | Disposition: A | Discharge status: Home / Self Care | Payer: No Typology Code available for payment source | Source: Ambulatory Visit | Attending: Vascular Surgery | Admitting: Vascular Surgery

## 2020-12-04 ENCOUNTER — Other Ambulatory Visit: Payer: Self-pay

## 2020-12-04 ENCOUNTER — Ambulatory Visit: Payer: No Typology Code available for payment source | Admitting: Physician Assistant

## 2020-12-04 VITALS — BP 127/86 | HR 84 | Temp 98.0°F | Resp 20 | Ht 63.0 in | Wt 137.6 lb

## 2020-12-04 DIAGNOSIS — I83893 Varicose veins of bilateral lower extremities with other complications: Secondary | ICD-10-CM | POA: Diagnosis not present

## 2020-12-04 DIAGNOSIS — I872 Venous insufficiency (chronic) (peripheral): Secondary | ICD-10-CM

## 2020-12-04 NOTE — Progress Notes (Signed)
[facility]   Reason for referral: spider veins B legs  History of Present Illness  Kaitlin Suarez is a 40 y.o. female who presents with chief complaint:spider veins without swolling leg.  Patient notes spider veins for > 24 months ago, associated with working on her feet all day.  The patient has had no history of DVT, no history of varicose vein, no history of venous stasis ulcers, no history of  Lymphedema and positive spider veins history of skin changes in lower legs.  There is no  family history of venous disorders.  The patient has not used compression stockings in the past.  She denise swelling, venous ulcers or heavy feeling with pain in her lower extremities.    No past medical history on file.  Past Surgical History:  Procedure Laterality Date  . CESAREAN SECTION      Social History   Socioeconomic History  . Marital status: Married    Spouse name: Not on file  . Number of children: Not on file  . Years of education: Not on file  . Highest education level: Not on file  Occupational History  . Not on file  Tobacco Use  . Smoking status: Never Smoker  . Smokeless tobacco: Never Used  Vaping Use  . Vaping Use: Never used  Substance and Sexual Activity  . Alcohol use: Never  . Drug use: Never  . Sexual activity: Yes    Partners: Female  Other Topics Concern  . Not on file  Social History Narrative  . Not on file   Social Determinants of Health   Financial Resource Strain: Not on file  Food Insecurity: Not on file  Transportation Needs: Not on file  Physical Activity: Not on file  Stress: Not on file  Social Connections: Not on file  Intimate Partner Violence: Not on file    Family History  Problem Relation Age of Onset  . Hypertension Mother   . Hypertension Father   . Colon cancer Neg Hx   . Esophageal cancer Neg Hx   . Pancreatic cancer Neg Hx   . Stomach cancer Neg Hx     Current Outpatient Medications on File  Prior to Visit  Medication Sig Dispense Refill  . omeprazole (PRILOSEC) 40 MG capsule TAKE 1 CAPSULE BY MOUTH EVERY DAY 90 capsule 0  . glycopyrrolate (ROBINUL) 2 MG tablet Take 1 tablet before meals at breakfast and dinner. (Patient not taking: Reported on 12/04/2020) 60 tablet 6   No current facility-administered medications on file prior to visit.    Allergies as of 12/04/2020  . (No Known Allergies)     ROS:   General:  No weight loss, Fever, chills  HEENT: No recent headaches, no nasal bleeding, no visual changes, no sore throat  Neurologic: No dizziness, blackouts, seizures. No recent symptoms of stroke or mini- stroke. No recent episodes of slurred speech, or temporary blindness.  Cardiac: No recent episodes of chest pain/pressure, no shortness of breath at rest.  No shortness of breath with exertion.  Denies history of atrial fibrillation or irregular heartbeat  Vascular: No history of rest pain in feet.  No history of claudication.  No history of non-healing ulcer, No history of DVT   Pulmonary: No home oxygen, no productive cough, no hemoptysis,  No asthma or wheezing  Musculoskeletal:  [ ]  Arthritis, [ ]  Low back pain,  [ ]  Joint pain  Hematologic:No history of hypercoagulable state.  No history of easy  bleeding.  No history of anemia  Gastrointestinal: No hematochezia or melena,  No gastroesophageal reflux, no trouble swallowing  Urinary: [ ]  chronic Kidney disease, [ ]  on HD - [ ]  MWF or [ ]  TTHS, [ ]  Burning with urination, [ ]  Frequent urination, [ ]  Difficulty urinating;   Skin: No rashes  Psychological: No history of anxiety,  No history of depression  Physical Examination  Vitals:   12/04/20 0957  BP: 127/86  Pulse: 84  Resp: 20  Temp: 98 F (36.7 C)  TempSrc: Temporal  SpO2: 99%  Weight: 137 lb 9.6 oz (62.4 kg)  Height: 5\' 3"  (1.6 m)    Body mass index is 24.37 kg/m.  General:  Alert and oriented, no acute distress HEENT: Normal Neck: No  bruit or JVD Pulmonary: Clear to auscultation bilaterally Cardiac: Regular Rate and Rhythm without murmur Abdomen: Soft, non-tender, non-distended, no mass, no scars Skin: No rash      Extremity Pulses:  2+ radial, brachial, femoral, dorsalis pedis, posterior tibial pulses bilaterally Musculoskeletal: No deformity or edema  Neurologic: Upper and lower extremity motor 5/5 and symmetric  DATA:   Venous Reflux Times  +--------------+---------+------+-----------+------------+--------+  RIGHT     Reflux NoRefluxReflux TimeDiameter cmsComments               Yes                   +--------------+---------+------+-----------+------------+--------+  CFV            yes  >1 second             +--------------+---------+------+-----------+------------+--------+  FV prox    no                         +--------------+---------+------+-----------+------------+--------+  FV mid    no                         +--------------+---------+------+-----------+------------+--------+  FV dist    no                         +--------------+---------+------+-----------+------------+--------+  Popliteal   no                         +--------------+---------+------+-----------+------------+--------+  GSV at [facility]  no               0.542        +--------------+---------+------+-----------+------------+--------+  GSV prox thigh      yes  >500 ms   0.304        +--------------+---------+------+-----------+------------+--------+  GSV mid thigh       yes  >500 ms   0.286        +--------------+---------+------+-----------+------------+--------+  GSV dist thigh      yes  >500 ms   0.323         +--------------+---------+------+-----------+------------+--------+  GSV at knee  no               0.378        +--------------+---------+------+-----------+------------+--------+  GSV prox calf                 0.225        +--------------+---------+------+-----------+------------+--------+  GSV mid calf                 0.190        +--------------+---------+------+-----------+------------+--------+  SSV Pop Fossa no  0.242        +--------------+---------+------+-----------+------------+--------+  SSV prox calf no               0.187        +--------------+---------+------+-----------+------------+--------+  SSV mid calf                 0.205        +--------------+---------+------+-----------+------------+--------+     +--------------+---------+------+-----------+------------+--------+  LEFT     Reflux NoRefluxReflux TimeDiameter cmsComments               Yes                   +--------------+---------+------+-----------+------------+--------+  CFV            yes  >1 second             +--------------+---------+------+-----------+------------+--------+  FV prox    no                         +--------------+---------+------+-----------+------------+--------+  FV mid    no                         +--------------+---------+------+-----------+------------+--------+  FV dist    no                         +--------------+---------+------+-----------+------------+--------+  Popliteal   no                         +--------------+---------+------+-----------+------------+--------+  GSV at SFJ        yes  >500 ms    0.574        +--------------+---------+------+-----------+------------+--------+  GSV prox thigh      yes  >500 ms   0.328        +--------------+---------+------+-----------+------------+--------+  GSV mid thigh       yes  >500 ms   0.355        +--------------+---------+------+-----------+------------+--------+  GSV dist thigh      yes  >500 ms   0.260        +--------------+---------+------+-----------+------------+--------+  GSV at knee  no               0.392        +--------------+---------+------+-----------+------------+--------+  GSV prox calf                 0.140        +--------------+---------+------+-----------+------------+--------+  GSV mid calf                 0.217        +--------------+---------+------+-----------+------------+--------+  SSV Pop Fossa no               0.376        +--------------+---------+------+-----------+------------+--------+  SSV prox calf no               0.386        +--------------+---------+------+-----------+------------+--------+  SSV mid calf                 0.339        +--------------+---------+------+-----------+------------+--------+         Summary:  Bilateral:  - No evidence of deep vein thrombosis seen in the lower extremities,  bilaterally, from the common femoral through the popliteal veins.  - No evidence of superficial venous thrombosis in the lower extremities,  bilaterally.    Right:  - No evidence of  superficial venous reflux seen in the right short  saphenous vein.  - Venous reflux is noted in the right common femoral vein.  - Venous reflux is noted in the right greater saphenous vein in the thigh.  - Symptomatic telangiectasia at posterolateral knee connect to lateral  thigh branch.     Left:  - No evidence of superficial venous reflux seen in the left short  saphenous vein.  - Venous reflux is noted in the left common femoral vein.  - Venous reflux is noted in the left sapheno-femoral junction.  - Venous reflux is noted in the left greater saphenous vein in the thigh.    Assessment/Plan: Venous reflux with visible patches of "spider" veins Her GSV reflux does not meet criteria for laser ablation, nor do her symptoms.    She will talk to Izora Gala our [place] therapy RN.  If she decides to have sclero therapy she will purchase thigh high compression.  If she declines the sclero therapy she would benefit from daily knee high mild compression.   She has palpable pedal pulses and is not at risk of limb loss.  F/U PRN.     Roxy Horseman PA-C [facility] Office: [phone]  MD in clinic [place]

## 2020-12-30 ENCOUNTER — Other Ambulatory Visit: Payer: Self-pay

## 2020-12-30 ENCOUNTER — Ambulatory Visit: Payer: No Typology Code available for payment source

## 2020-12-30 DIAGNOSIS — I8393 Asymptomatic varicose veins of bilateral lower extremities: Secondary | ICD-10-CM

## 2020-12-30 NOTE — Progress Notes (Signed)
Treated pt's small reticular and spider veins in bilateral upper legs with Asclera 1% administered with a 27g butterfly.  Patient received a total of 1.5 mL. Tolerated well. Spanish interpretor ipad was used for entire duration of visit. Easy access; anticipate good results. Pt was given post procedure instructions verbally and on spanish translated handout.  Will follow PRN.   Photos: Yes.    Compression stockings applied: Yes.

## 2021-06-12 ENCOUNTER — Telehealth (INDEPENDENT_AMBULATORY_CARE_PROVIDER_SITE_OTHER): Payer: No Typology Code available for payment source | Admitting: Adult Health

## 2021-06-12 ENCOUNTER — Encounter: Payer: Self-pay | Admitting: Adult Health

## 2021-06-12 DIAGNOSIS — J069 Acute upper respiratory infection, unspecified: Secondary | ICD-10-CM | POA: Diagnosis not present

## 2021-06-12 MED ORDER — FLUTICASONE PROPIONATE 50 MCG/ACT NA SUSP: 2.0000 | Freq: Every day | NASAL | 6 refills | Status: DC

## 2021-06-12 MED ORDER — BENZONATATE 200 MG PO CAPS
200.0000 mg | ORAL_CAPSULE | Freq: Two times a day (BID) | ORAL | 1 refills | Status: DC | PRN
Start: 2021-06-12 — End: 2021-09-16

## 2021-06-12 NOTE — Progress Notes (Signed)
Virtual Visit via Telephone Note  I connected with Kaitlin Suarez on 06/12/21 at  2:30 PM EST by telephone and verified that I am speaking with the correct person using two identifiers.   I discussed the limitations, risks, security and privacy concerns of performing an evaluation and management service by telephone and the availability of in person appointments. I also discussed with the patient that there may be a patient responsible charge related to this service. The patient expressed understanding and agreed to proceed.  Location patient: home Location provider: work or home office Participants present for the call: patient, provider Patient did not have a visit in the prior 7 days to address this/these issue(s).  Spanish interpreter present.   History of Present Illness: 40 year old female who is being evaluated today for an acute issue.  Symptoms started roughly 1 day ago.  Symptoms include sore throat, headache, sinus pain and pressure, postnasal drip, dry cough, chills, and subjective fever.  She denies nausea, vomiting, diarrhea.  She did take a COVID test which was negative.  She denies any sick contacts at home but does work in [employer].  She has been using NyQuil without relief.   Observations/Objective: Patient sounds cheerful and well on the phone. I do not appreciate any SOB. Dry cough present  Speech and thought processing are grossly intact. Patient reported vitals:  Assessment and Plan: 1. Viral upper respiratory tract infection -Likely viral sinusitis.  Explained natural progression of viral infections to the patient.  If she is not feeling any better in the next 7 days and advised to follow-up with PCP.  We will treat with Flonase and Tessalon Perles.  She was advised to stay hydrated and rest. - fluticasone (FLONASE) 50 MCG/ACT nasal spray; Place 2 sprays into both nostrils daily.  Dispense: 16 g; Refill: 6 - benzonatate (TESSALON) 200 MG capsule; Take 1  capsule (200 mg total) by mouth 2 (two) times daily as needed for cough.  Dispense: 20 capsule; Refill: 1   Follow Up Instructions:   I did not refer this patient for an OV in the next 24 hours for this/these issue(s).  I discussed the assessment and treatment plan with the patient. The patient was provided an opportunity to ask questions and all were answered. The patient agreed with the plan and demonstrated an understanding of the instructions.   The patient was advised to call back or seek an in-person evaluation if the symptoms worsen or if the condition fails to improve as anticipated.  I provided 23 minutes of non-face-to-face time during this encounter.   Dorothyann Peng, NP

## 2021-09-16 ENCOUNTER — Other Ambulatory Visit: Payer: Self-pay

## 2021-09-16 ENCOUNTER — Encounter: Payer: Self-pay | Admitting: Emergency Medicine

## 2021-09-16 ENCOUNTER — Other Ambulatory Visit: Payer: Self-pay | Admitting: Physician Assistant

## 2021-09-16 ENCOUNTER — Ambulatory Visit: Payer: No Typology Code available for payment source | Admitting: Emergency Medicine

## 2021-09-16 ENCOUNTER — Ambulatory Visit (INDEPENDENT_AMBULATORY_CARE_PROVIDER_SITE_OTHER): Payer: No Typology Code available for payment source

## 2021-09-16 DIAGNOSIS — G8929 Other chronic pain: Secondary | ICD-10-CM

## 2021-09-16 DIAGNOSIS — M25512 Pain in left shoulder: Secondary | ICD-10-CM

## 2021-09-16 DIAGNOSIS — R1013 Epigastric pain: Secondary | ICD-10-CM

## 2021-09-16 LAB — COMPREHENSIVE METABOLIC PANEL
ALT: 13 U/L (ref 0–35)
AST: 17 U/L (ref 0–37)
Albumin: 4.5 g/dL (ref 3.5–5.2)
Alkaline Phosphatase: 49 U/L (ref 39–117)
BUN: 8 mg/dL (ref 6–23)
CO2: 29 mEq/L (ref 19–32)
Calcium: 9.4 mg/dL (ref 8.4–10.5)
Chloride: 106 mEq/L (ref 96–112)
Creatinine, Ser: 0.73 mg/dL (ref 0.40–1.20)
GFR: 102.1 mL/min (ref >60.00)
Glucose, Bld: 82 mg/dL (ref 70–99)
Potassium: 3.8 mEq/L (ref 3.5–5.1)
Sodium: 140 mEq/L (ref 135–145)
Total Bilirubin: 0.5 mg/dL (ref 0.2–1.2)
Total Protein: 7.6 g/dL (ref 6.0–8.3)

## 2021-09-16 LAB — CBC WITH DIFFERENTIAL/PLATELET
Basophils Absolute: 0 10*3/uL (ref 0.0–0.1)
Basophils Relative: 0.5 % (ref 0.0–3.0)
Eosinophils Absolute: 0.1 10*3/uL (ref 0.0–0.7)
Eosinophils Relative: 1.2 % (ref 0.0–5.0)
HCT: 42.6 % (ref 36.0–46.0)
Hemoglobin: 14.4 g/dL (ref 12.0–15.0)
Lymphocytes Relative: 29.7 % (ref 12.0–46.0)
Lymphs Abs: 1.9 10*3/uL (ref 0.7–4.0)
MCHC: 33.7 g/dL (ref 30.0–36.0)
MCV: 91.8 fl (ref 78.0–100.0)
Monocytes Absolute: 0.5 10*3/uL (ref 0.1–1.0)
Monocytes Relative: 7.2 % (ref 3.0–12.0)
Neutro Abs: 4 10*3/uL (ref 1.4–7.7)
Neutrophils Relative %: 61.4 % (ref 43.0–77.0)
Platelets: 303 10*3/uL (ref 150.0–400.0)
RBC: 4.65 Mil/uL (ref 3.87–5.11)
RDW: 12.9 % (ref 11.5–15.5)
WBC: 6.5 10*3/uL (ref 4.0–10.5)

## 2021-09-16 LAB — LIPASE: Lipase: 37 U/L (ref 11.0–59.0)

## 2021-09-16 LAB — LIPID PANEL
Cholesterol: 178 mg/dL (ref 0–200)
HDL: 52.8 mg/dL (ref >39.00)
LDL Cholesterol: 98 mg/dL (ref 0–99)
NonHDL: 125.56
Total CHOL/HDL Ratio: 3
Triglycerides: 138 mg/dL (ref 0.0–149.0)
VLDL: 27.6 mg/dL (ref 0.0–40.0)

## 2021-09-16 MED ORDER — PANTOPRAZOLE SODIUM 40 MG PO TBEC: 40.0000 mg | DELAYED_RELEASE_TABLET | Freq: Every day | ORAL | 3 refills | Status: DC

## 2021-09-16 NOTE — Assessment & Plan Note (Signed)
We will start PPI treatment today with pantoprazole 40 mg daily. Diet and nutrition discussed. We will need GI follow-up for upper endoscopy.

## 2021-09-16 NOTE — Assessment & Plan Note (Signed)
Tylenol as needed for pain.  Avoid NSAIDs. Recommend to follow-up with sports medicine. X-ray done today looks unremarkable.

## 2021-09-16 NOTE — Patient Instructions (Signed)
Abdominal Pain, Adult Many things can cause belly (abdominal) pain. Most times, belly pain is not dangerous. Many cases of belly pain can be watched and treated at home. Sometimes, though, belly pain is serious. Yourdoctor will try to find the cause of your belly pain. Follow these instructions at home:  Medicines Take over-the-counter and prescription medicines only as told by your doctor. Do not take medicines that help you poop (laxatives) unless told by your doctor. General instructions Watch your belly pain for any changes. Drink enough fluid to keep your pee (urine) pale yellow. Keep all follow-up visits as told by your doctor. This is important. Contact a doctor if: Your belly pain changes or gets worse. You are not hungry, or you lose weight without trying. You are having trouble pooping (constipated) or have watery poop (diarrhea) for more than 2-3 days. You have pain when you pee or poop. Your belly pain wakes you up at night. Your pain gets worse with meals, after eating, or with certain foods. You are vomiting and cannot keep anything down. You have a fever. You have blood in your pee. Get help right away if: Your pain does not go away as soon as your doctor says it should. You cannot stop vomiting. Your pain is only in areas of your belly, such as the right side or the left lower part of the belly. You have bloody or black poop, or poop that looks like tar. You have very bad pain, cramping, or bloating in your belly. You have signs of not having enough fluid or water in your body (dehydration), such as: Dark pee, very little pee, or no pee. Cracked lips. Dry mouth. Sunken eyes. Sleepiness. Weakness. You have trouble breathing or chest pain. Summary Many cases of belly pain can be watched and treated at home. Watch your belly pain for any changes. Take over-the-counter and prescription medicines only as told by your doctor. Contact a doctor if your belly pain  changes or gets worse. Get help right away if you have very bad pain, cramping, or bloating in your belly. This information is not intended to replace advice given to you by your health care provider. Make sure you discuss any questions you have with your healthcare provider. Document Revised: 11/20/2018 Document Reviewed: 11/20/2018 Elsevier Patient Education  2022 Elsevier Inc.  

## 2021-09-16 NOTE — Progress Notes (Signed)
Kaitlin Suarez 41 y.o.   Chief Complaint  Patient presents with   Shoulder Pain    Left side, also discuss stomach pain, x 1 week    HISTORY OF PRESENT ILLNESS: HISTORY OF PRESENT ILLNESS: This is a 41 y.o. female complaining of 2 things: 1.  Chronic left shoulder pain for least a year 2.  Upper abdominal pain.  Intermittent episodes, almost daily for the past 1 to 2 years, made worse by eating.  Denies melena or rectal bleeding.  Denies nausea or vomiting.  Seen by me on 08/23/2018 with similar symptoms.  Responded well to PPI. No other complaints or medical concerns today.  Shoulder Pain  Pertinent negatives include no fever.    Prior to Admission medications   Medication Sig Start Date End Date Taking? Authorizing Provider  pantoprazole (PROTONIX) 40 MG tablet Take 1 tablet (40 mg total) by mouth daily. 09/16/21  Yes Kinsler Soeder, Ines Bloomer, MD  fluticasone [facility]) 50 MCG/ACT nasal spray Place 2 sprays into both nostrils daily. 06/12/21   Nafziger, Tommi Rumps, NP  glycopyrrolate (ROBINUL) 2 MG tablet Take 1 tablet before meals at breakfast and dinner. Patient not taking: Reported on 12/04/2020 06/03/20   Esterwood, Amy S, PA-C    No Known Allergies  There are no problems to display for this patient.   No past medical history on file.  Past Surgical History:  Procedure Laterality Date   CESAREAN SECTION      Social History   Socioeconomic History   Marital status: Married    Spouse name: Not on file   Number of children: Not on file   Years of education: Not on file   Highest education level: Not on file  Occupational History   Not on file  Tobacco Use   Smoking status: Never   Smokeless tobacco: Never  Vaping Use   Vaping Use: Never used  Substance and Sexual Activity   Alcohol use: Never   Drug use: Never   Sexual activity: Yes    Partners: Female  Other Topics Concern   Not on file  Social History Narrative   Not on file   Social Determinants of Health    Financial Resource Strain: Not on file  Food Insecurity: Not on file  Transportation Needs: Not on file  Physical Activity: Not on file  Stress: Not on file  Social Connections: Not on file  Intimate Partner Violence: Not on file    Family History  Problem Relation Age of Onset   Hypertension Mother    Hypertension Father    Colon cancer Neg Hx    Esophageal cancer Neg Hx    Pancreatic cancer Neg Hx    Stomach cancer Neg Hx      Review of Systems  Constitutional: Negative.  Negative for chills and fever.  HENT: Negative.  Negative for congestion and sore throat.   Respiratory: Negative.  Negative for cough and shortness of breath.   Cardiovascular: Negative.  Negative for chest pain.  Gastrointestinal:  Positive for abdominal pain. Negative for blood in stool, constipation, diarrhea, melena, nausea and vomiting.  Genitourinary:  Negative for dysuria and urgency.  Musculoskeletal:  Positive for joint pain (Left shoulder pain).  Skin: Negative.  Negative for rash.  Neurological:  Negative for dizziness and headaches.  All other systems reviewed and are negative. Today's Vitals   09/16/21 1119  BP: (P) 116/60  Pulse: (P) 78  SpO2: (P) 96%  Weight: (P) 135 lb (61.2 kg)  Height: (P)  5\' 3"  (1.6 m)   Body mass index is 23.91 kg/m (pended).   Physical Exam Vitals reviewed.  Constitutional:      Appearance: Normal appearance.  HENT:     Head: Normocephalic.     Mouth/Throat:     Mouth: Mucous membranes are moist.     Pharynx: Oropharynx is clear.  Eyes:     Extraocular Movements: Extraocular movements intact.     Conjunctiva/sclera: Conjunctivae normal.     Pupils: Pupils are equal, round, and reactive to light.  Cardiovascular:     Rate and Rhythm: Normal rate and regular rhythm.     Pulses: Normal pulses.     Heart sounds: Normal heart sounds.  Pulmonary:     Effort: Pulmonary effort is normal.     Breath sounds: Normal breath sounds.  Abdominal:      General: Bowel sounds are normal. There is no distension.     Palpations: Abdomen is soft.     Tenderness: There is no abdominal tenderness.  Musculoskeletal:        General: Normal range of motion.     Cervical back: No tenderness.     Comments: Left shoulder: No tenderness.  Full range of motion  Lymphadenopathy:     Cervical: No cervical adenopathy.  Skin:    General: Skin is warm and dry.     Capillary Refill: Capillary refill takes less than 2 seconds.  Neurological:     General: No focal deficit present.     Mental Status: She is alert and oriented to person, place, and time.  Psychiatric:        Mood and Affect: Mood normal.        Behavior: Behavior normal.    DG Shoulder Left  Result Date: 09/16/2021 CLINICAL DATA:  Chronic left shoulder pain. EXAM: LEFT SHOULDER - 2+ VIEW COMPARISON:  None. FINDINGS: Normal anatomic alignment. No evidence for acute fracture or dislocation. Visualized left hemithorax is unremarkable. IMPRESSION: No acute osseous abnormality. Electronically Signed   By: Lovey Newcomer M.D.   On: 09/16/2021 12:04    ASSESSMENT & PLAN: Problem List Items Addressed This Visit       Other   Epigastric pain - Primary    We will start PPI treatment today with pantoprazole 40 mg daily. Diet and nutrition discussed. We will need GI follow-up for upper endoscopy.      Relevant Medications   pantoprazole (PROTONIX) 40 MG tablet   Other Relevant Orders   Ambulatory referral to Gastroenterology   Lipase   CBC with Differential/Platelet   Comprehensive metabolic panel   Lipid panel   Chronic left shoulder pain    Tylenol as needed for pain.  Avoid NSAIDs. Recommend to follow-up with sports medicine. X-ray done today looks unremarkable.      Relevant Orders   Ambulatory referral to Sports Medicine   DG Shoulder Left (Completed)   Patient Instructions  Abdominal Pain, Adult Many things can cause belly (abdominal) pain. Most times, belly pain is not  dangerous. Many cases of belly pain can be watched and treated at home. Sometimes, though, belly pain is serious. Your doctor will try to find the cause of your belly pain. Follow these instructions at home: Medicines Take over-the-counter and prescription medicines only as told by your doctor. Do not take medicines that help you poop (laxatives) unless told by your doctor. General instructions Watch your belly pain for any changes. Drink enough fluid to keep your pee (urine) pale yellow.  Keep all follow-up visits as told by your doctor. This is important. Contact a doctor if: Your belly pain changes or gets worse. You are not hungry, or you lose weight without trying. You are having trouble pooping (constipated) or have watery poop (diarrhea) for more than 2-3 days. You have pain when you pee or poop. Your belly pain wakes you up at night. Your pain gets worse with meals, after eating, or with certain foods. You are vomiting and cannot keep anything down. You have a fever. You have blood in your pee. Get help right away if: Your pain does not go away as soon as your doctor says it should. You cannot stop vomiting. Your pain is only in areas of your belly, such as the right side or the left lower part of the belly. You have bloody or black poop, or poop that looks like tar. You have very bad pain, cramping, or bloating in your belly. You have signs of not having enough fluid or water in your body (dehydration), such as: Dark pee, very little pee, or no pee. Cracked lips. Dry mouth. Sunken eyes. Sleepiness. Weakness. You have trouble breathing or chest pain. Summary Many cases of belly pain can be watched and treated at home. Watch your belly pain for any changes. Take over-the-counter and prescription medicines only as told by your doctor. Contact a doctor if your belly pain changes or gets worse. Get help right away if you have very bad pain, cramping, or bloating in your  belly. This information is not intended to replace advice given to you by your health care provider. Make sure you discuss any questions you have with your health care provider. Document Revised: 11/20/2018 Document Reviewed: 11/20/2018 Elsevier Patient Education  2022 [place], MD [facility]

## 2021-09-24 NOTE — Progress Notes (Signed)
? ? ?Subjective:   ? ?CC: L shoulder pain ? ?I, Wendy Poet, LAT, ATC, am serving as scribe for Dr. Lynne Leader. ? ?HPI: Pt is a 41 y/o female presenting w/ c/o L shoulder pain x one year w/ no known MOI.  She locates her pain to her L superior shoulder. ? ?Radiating pain: yes into her L upper trap and L ant shoulder ?Neck pain: yes, L-sided neck pain ?L shoulder mechanical symptoms: yes intermittently ?Aggravating factors: lifting boxes; end-range overhead AROM; L shoulder functional IR; L shoulder horiz aDd ?Treatments tried: Tylenol; topical pain relieving cream ? ?Diagnostic testing: L shoulder XR- 09/16/21 ? ?Pertinent review of Systems: No fevers or chills ? ?Relevant historical information: Otherwise healthy.  Radio broadcast assistant at [employer]. ? ? ?Objective:   ? ?Vitals:  ? 09/25/21 0941  ?BP: 104/72  ?Pulse: 100  ?SpO2: 97%  ? ?General: Well Developed, well nourished, and in no acute distress.  ? ?MSK: C-spine: Normal-appearing ?Nontender midline. ?Tender palpation left trapezius. ?Normal cervical motion. ?Negative Spurling's test. ?Upper extremity strength is intact and equal bilaterally except noted below. ?Reflexes are equal. ? ?Left shoulder: Normal-appearing ?Normal motion pain with abduction. ?Tender palpation AC joint. ?Intact strength. ?Positive Hawkins and Neer's test. ?Mildly positive Yergason's and speeds test. ? ?Lab and Radiology Results ? ?Procedure: Real-time Ultrasound Guided Injection of the shoulder subacromial bursa ?Device: Philips Affiniti 50G ?Images permanently stored and available for review in PACS ?Ultrasound evaluation prior to injection reveals mild subacromial bursitis.  Intact rotator cuff tendons. ?Verbal informed consent obtained.  Discussed risks and benefits of procedure. Warned about infection bleeding damage to structures skin hypopigmentation and fat atrophy among others. ?Patient expresses understanding and agreement ?Time-out conducted.   ?Noted no overlying  erythema, induration, or other signs of local infection.   ?Skin prepped in a sterile fashion.   ?Local anesthesia: Topical Ethyl chloride.   ?With sterile technique and under real time ultrasound guidance: 40 mg of Kenalog and 2 mL of Marcaine injected into the subacromial bursa . Fluid seen entering the bursa.   ?Completed without difficulty   ?Pain immediately resolved suggesting accurate placement of the medication.   ?Advised to call if fevers/chills, erythema, induration, drainage, or persistent bleeding.   ?Images permanently stored and available for review in the ultrasound unit.  ?Impression: Technically successful ultrasound guided injection. ? ? ? ?X-ray images left shoulder obtained today personally and independently interpreted ?Mild AC DJD.  No acute fractures.  No significant glenohumeral DJD. ?Await formal radiology review ? ? ? ?Impression and Recommendations:   ? ?Assessment and Plan: ?41 y.o. female with left shoulder pain thought to be primarily due to subacromial impingement and bursitis.  She also has some trapezius dysfunction.  Plan for subacromial injection and PT.  Check back in 8 weeks.  Visit conducted using a Spanish interpreter.. ? ?PDMP not reviewed this encounter. ?Orders Placed This Encounter  ?Procedures  ? [place] LIMITED JOINT SPACE STRUCTURES UP LEFT(NO LINKED CHARGES)  ?  Order Specific Question:   Reason for Exam (SYMPTOM  OR DIAGNOSIS REQUIRED)  ?  Answer:   L shoulder pain  ?  Order Specific Question:   Preferred imaging location?  ?  Answer:   [place]  ? Ambulatory referral to Physical Therapy  ?  Referral Priority:   Routine  ?  Referral Type:   Physical Medicine  ?  Referral Reason:   Specialty Services Required  ?  Requested Specialty:   Physical  Therapy  ?  Number of Visits Requested:   1  ? ?No orders of the defined types were placed in this encounter. ? ? ?Discussed warning signs or symptoms. Please see discharge instructions. Patient expresses  understanding. ? ? ?The above documentation has been reviewed and is accurate and complete Lynne Leader, M.D. ? ?

## 2021-09-25 ENCOUNTER — Ambulatory Visit: Payer: Self-pay

## 2021-09-25 ENCOUNTER — Other Ambulatory Visit: Payer: Self-pay

## 2021-09-25 ENCOUNTER — Ambulatory Visit: Payer: No Typology Code available for payment source | Admitting: Family Medicine

## 2021-09-25 ENCOUNTER — Encounter: Payer: Self-pay | Admitting: Family Medicine

## 2021-09-25 VITALS — BP 104/72 | HR 100 | Ht 63.0 in | Wt 136.0 lb

## 2021-09-25 DIAGNOSIS — M25512 Pain in left shoulder: Secondary | ICD-10-CM

## 2021-09-25 DIAGNOSIS — G8929 Other chronic pain: Secondary | ICD-10-CM | POA: Diagnosis not present

## 2021-09-25 NOTE — Patient Instructions (Addendum)
Nice to meet you today. ? ?You had a L shoulder injection.  Call or go to the ER if you develop a large red swollen joint with extreme pain or oozing puss.  ? ?I've referred you to Physical Therapy to the [facility] location.  Their office will call you to schedule but if you don't hear from them in one week regarding scheduling, please call them directly at [phone]. ? ?Follow-up: 8 weeks ?

## 2021-11-17 ENCOUNTER — Telehealth: Payer: Self-pay | Admitting: Emergency Medicine

## 2021-11-17 DIAGNOSIS — N912 Amenorrhea, unspecified: Secondary | ICD-10-CM

## 2021-11-17 NOTE — Telephone Encounter (Signed)
Called pt spouse to inform him of provider recommendation as well as informing him that a referral to GYN has been placed for the patient. Patient spouse verbalize understanding ?

## 2021-11-17 NOTE — Telephone Encounter (Signed)
Pts spouse sates pt has not had her menstrual cycle in 12 days ? ?Spouse inquiring if provide has any gyn recommendations preferably one who speaks spanish ? ?Please advise ?

## 2021-11-17 NOTE — Telephone Encounter (Signed)
Okay to refer to GYN.  Recommend over-the-counter pregnancy test also.  Thank you.

## 2021-11-20 ENCOUNTER — Ambulatory Visit: Payer: No Typology Code available for payment source | Admitting: Family Medicine

## 2021-11-20 NOTE — Progress Notes (Deleted)
   I, Peterson Lombard, LAT, ATC acting as a scribe for Lynne Leader, MD.  Kaitlin Suarez is a 41 y.o. female who presents to [place] at [facility] today for f/u chronic L shoulder pain. Pt was last seen by Dr. Georgina Snell on 09/25/21 and was given a L subacromial steroid injection and was referred to PT, but has not yet scheduled any visits. Today, pt reports  Dx imaging: 09/16/21 L shoulder XR  Pertinent review of systems: ***  Relevant historical information: ***   Exam:  There were no vitals taken for this visit. General: Well Developed, well nourished, and in no acute distress.   MSK: ***    Lab and Radiology Results No results found for this or any previous visit (from the past 72 hour(s)). No results found.     Assessment and Plan: 41 y.o. female with ***   PDMP not reviewed this encounter. No orders of the defined types were placed in this encounter.  No orders of the defined types were placed in this encounter.    Discussed warning signs or symptoms. Please see discharge instructions. Patient expresses understanding.   ***

## 2021-11-23 ENCOUNTER — Encounter: Payer: Self-pay | Admitting: *Deleted

## 2021-12-07 ENCOUNTER — Other Ambulatory Visit: Payer: Self-pay | Admitting: Emergency Medicine

## 2021-12-07 DIAGNOSIS — R1013 Epigastric pain: Secondary | ICD-10-CM

## 2021-12-11 ENCOUNTER — Encounter: Payer: Self-pay | Admitting: Physician Assistant

## 2022-01-01 ENCOUNTER — Ambulatory Visit: Payer: No Typology Code available for payment source | Admitting: Physician Assistant

## 2022-01-01 ENCOUNTER — Encounter: Payer: Self-pay | Admitting: Physician Assistant

## 2022-01-01 VITALS — BP 110/78 | HR 82 | Ht 63.0 in | Wt 133.0 lb

## 2022-01-01 DIAGNOSIS — K219 Gastro-esophageal reflux disease without esophagitis: Secondary | ICD-10-CM

## 2022-01-01 DIAGNOSIS — R1013 Epigastric pain: Secondary | ICD-10-CM | POA: Diagnosis not present

## 2022-01-01 MED ORDER — PANTOPRAZOLE SODIUM 40 MG PO TBEC: 40.0000 mg | DELAYED_RELEASE_TABLET | Freq: Two times a day (BID) | ORAL | 2 refills | Status: DC

## 2022-01-01 NOTE — Progress Notes (Signed)
Chief Complaint: Bloating and GERD  HPI:    Mr. Harbach is a 41 year old Hispanic female, known to Dr. Ardis Hughs, who was referred to me by Horald Pollen, * for a complaint of bloating and GERD.      06/11/2020 colonoscopy with one 3 mm polyp in the rectum and otherwise normal.  Patient continued on Glycopyrrolate twice daily.    09/16/2021 seen by PCP for epigastric pain.  Patient was started on Pantoprazole 40 mg daily and recommended GI follow-up for EGD.  Labs at that time with normal CMP, CBC and lipase.    Today, patient seen with assistance of an interpreter, the patient tells me that she has been experiencing an upper umbilical/epigastric pain over the past year with reflux symptoms.  She was started on Pantoprazole 40 mg and this may be helps a little bit but does not completely resolve her issues.  When she is hungry this pain is worse.  Also with some experience of food feeling like it is getting hung in her throat.    Denies fever, chills, weight loss, change in bowel habits or blood in her stool.  Past Medical History:  Diagnosis Date   GERD (gastroesophageal reflux disease)     Past Surgical History:  Procedure Laterality Date   CESAREAN SECTION      Current Outpatient Medications  Medication Sig Dispense Refill   fluticasone (FLONASE) 50 MCG/ACT nasal spray Place 2 sprays into both nostrils daily. 16 g 6   glycopyrrolate (ROBINUL) 2 MG tablet TAKE 1 TABLET BEFORE MEALS AT BREAKFAST AND DINNER. 60 tablet 6   pantoprazole (PROTONIX) 40 MG tablet TAKE 1 TABLET BY MOUTH EVERY DAY 90 tablet 2   No current facility-administered medications for this visit.    Allergies as of 01/01/2022   (No Known Allergies)    Family History  Problem Relation Age of Onset   Hypertension Mother    Hypertension Father    Colon cancer Neg Hx    Esophageal cancer Neg Hx    Pancreatic cancer Neg Hx    Stomach cancer Neg Hx     Social History   Socioeconomic History   Marital  status: Married    Spouse name: Not on file   Number of children: Not on file   Years of education: Not on file   Highest education level: Not on file  Occupational History   Not on file  Tobacco Use   Smoking status: Never   Smokeless tobacco: Never  Vaping Use   Vaping Use: Never used  Substance and Sexual Activity   Alcohol use: Never   Drug use: Never   Sexual activity: Yes    Partners: Female  Other Topics Concern   Not on file  Social History Narrative   Not on file   Social Determinants of Health   Financial Resource Strain: Not on file  Food Insecurity: Not on file  Transportation Needs: Not on file  Physical Activity: Not on file  Stress: Not on file  Social Connections: Not on file  Intimate Partner Violence: Not on file    Review of Systems:    Constitutional: No weight loss, fever or chills Cardiovascular: No chest pain Respiratory: No SOB  Gastrointestinal: See HPI and otherwise negative   Physical Exam:  Vital signs: BP 110/78   Pulse 82   Ht '5\' 3"'$  (1.6 m)   Wt 133 lb (60.3 kg)   BMI 23.56 kg/m   Constitutional:   Pleasant Hispanic  female appears to be in NAD, Well developed, Well nourished, alert and cooperative Respiratory: Respirations even and unlabored. Lungs clear to auscultation bilaterally.   No wheezes, crackles, or rhonchi.  Cardiovascular: Normal S1, S2. No MRG. Regular rate and rhythm. No peripheral edema, cyanosis or pallor.  Gastrointestinal:  Soft, nondistended, nontender. No rebound or guarding. Normal bowel sounds. No appreciable masses or hepatomegaly. Rectal:  Not performed.  Psychiatric: Oriented to person, place and time. Demonstrates good judgement and reason without abnormal affect or behaviors.  RELEVANT LABS AND IMAGING: CBC    Component Value Date/Time   WBC 6.5 09/16/2021 1200   RBC 4.65 09/16/2021 1200   HGB 14.4 09/16/2021 1200   HGB 14.0 08/23/2018 0904   HCT 42.6 09/16/2021 1200   HCT 41.4 08/23/2018 0904    PLT 303.0 09/16/2021 1200   PLT 270 08/23/2018 0904   MCV 91.8 09/16/2021 1200   MCV 92 08/23/2018 0904   MCH 31.0 08/23/2018 0904   MCHC 33.7 09/16/2021 1200   RDW 12.9 09/16/2021 1200   RDW 11.9 08/23/2018 0904   LYMPHSABS 1.9 09/16/2021 1200   LYMPHSABS 1.4 08/23/2018 0904   MONOABS 0.5 09/16/2021 1200   EOSABS 0.1 09/16/2021 1200   EOSABS 0.1 08/23/2018 0904   BASOSABS 0.0 09/16/2021 1200   BASOSABS 0.0 08/23/2018 0904    CMP     Component Value Date/Time   NA 140 09/16/2021 1200   NA 140 08/23/2018 0904   K 3.8 09/16/2021 1200   CL 106 09/16/2021 1200   CO2 29 09/16/2021 1200   GLUCOSE 82 09/16/2021 1200   BUN 8 09/16/2021 1200   BUN 10 08/23/2018 0904   CREATININE 0.73 09/16/2021 1200   CALCIUM 9.4 09/16/2021 1200   PROT 7.6 09/16/2021 1200   PROT 6.9 08/23/2018 0904   ALBUMIN 4.5 09/16/2021 1200   ALBUMIN 4.1 08/23/2018 0904   AST 17 09/16/2021 1200   ALT 13 09/16/2021 1200   ALKPHOS 49 09/16/2021 1200   BILITOT 0.5 09/16/2021 1200   BILITOT 0.6 08/23/2018 0904   GFRNONAA 92 08/23/2018 0904   GFRAA 107 08/23/2018 0904    Assessment: 1.  GERD: Slightly better with Pantoprazole 40 mg once daily, with below, occasional dysphagia; consider stricture versus other 2.  Epigastric pain: Slightly improved with Pantoprazole 40 mg once daily, worse when she is hungry; likely related to gastritis/GERD  Plan: 1.  Scheduled patient for diagnostic EGD in the [place] with Dr. Ardis Hughs.  Did provide the patient a detailed list of risks for the procedure and she agrees to proceed. Patient is appropriate for endoscopic procedure(s) in the ambulatory ([place]) setting.  2.  Increased Pantoprazole to 40 mg twice daily, 30-60 minutes before breakfast and dinner.  Prescribed #60 with 3 refills. 3.  Reviewed antireflux diet and lifestyle modifications. 4.  Patient to follow in clinic per recommendations after time of procedures.  Ellouise Newer, PA-C [facility] 01/01/2022,  10:21 AM  Cc: Horald Pollen, *

## 2022-01-01 NOTE — Patient Instructions (Addendum)
Aumente su pantoprazol a [employer].  Hemos enviado los siguientes medicamentos a su farmacia para que los recoja a su conveniencia: pantoprazol  Le han programado una endoscopia. Siga las instrucciones escritas que se le dieron en su visita de hoy. Si [place] inhaladores (aunque solo sea necesario), trigalos el da de su procedimiento.  Si tiene 65 aos o ms, su ndice de [employer] corporal debe estar entre 23 y 34. Su ndice de masa corporal es de 23,56 kg/m. Si esto est fuera del rango mencionado anteriormente, considere hacer un seguimiento con su proveedor de Midwife.  Si tiene 63 aos o menos, su ndice de [employer] corporal debe estar entre 75 y 23. Su ndice de masa corporal es de 23,56 kg/m. Si esto est fuera del rango mencionado anteriormente, considere hacer un seguimiento con su proveedor de atencin primaria.  ________________________________________________________________  Increase your Pantoprazole to twice daily.   We have sent the following medications to your pharmacy for you to pick up at your convenience: Pantoprazole   You have been scheduled for an endoscopy. Please follow written instructions given to you at your visit today. If you use inhalers (even only as needed), please bring them with you on the day of your procedure.  If you are age 42 or older, your body mass index should be between 23-30. Your Body mass index is 23.56 kg/m. If this is out of the aforementioned range listed, please consider follow up with your Primary Care Provider.  If you are age 55 or younger, your body mass index should be between 19-25. Your Body mass index is 23.56 kg/m. If this is out of the aformentioned range listed, please consider follow up with your Primary Care Provider.   ________________________________________________________  The [facility] providers would like to encourage you to use [facility] to communicate with providers for non-urgent requests or questions.  Due  to long hold times on the telephone, sending your provider a message by [facility] may be a faster and more efficient way to get a response.  Please allow 48 business hours for a response.  Please remember that this is for non-urgent requests.  _______________________________________________________  Thank you for choosing me and [facility].  Ellouise Newer PA

## 2022-01-11 NOTE — Progress Notes (Signed)
I agree with the above note, plan 

## 2022-02-07 ENCOUNTER — Encounter: Payer: Self-pay | Admitting: Certified Registered Nurse Anesthetist

## 2022-02-12 ENCOUNTER — Encounter: Payer: Self-pay | Admitting: Gastroenterology

## 2022-02-12 ENCOUNTER — Ambulatory Visit (AMBULATORY_SURGERY_CENTER): Payer: No Typology Code available for payment source | Admitting: Gastroenterology

## 2022-02-12 VITALS — BP 111/83 | HR 77 | Temp 98.6°F | Resp 19 | Ht 63.0 in | Wt 133.0 lb

## 2022-02-12 DIAGNOSIS — R1013 Epigastric pain: Secondary | ICD-10-CM | POA: Diagnosis not present

## 2022-02-12 DIAGNOSIS — K219 Gastro-esophageal reflux disease without esophagitis: Secondary | ICD-10-CM

## 2022-02-12 DIAGNOSIS — R12 Heartburn: Secondary | ICD-10-CM

## 2022-02-12 DIAGNOSIS — K2960 Other gastritis without bleeding: Secondary | ICD-10-CM

## 2022-02-12 MED ORDER — SODIUM CHLORIDE 0.9 % IV SOLN: 500.0000 mL | INTRAVENOUS | Status: DC

## 2022-02-12 NOTE — Progress Notes (Signed)
HPI: This is a epigastric pain, GERD   ROS: complete GI ROS as described in HPI, all other review negative.  Constitutional:  No unintentional weight loss   Past Medical History:  Diagnosis Date   GERD (gastroesophageal reflux disease)     Past Surgical History:  Procedure Laterality Date   CESAREAN SECTION      Current Outpatient Medications  Medication Sig Dispense Refill   glycopyrrolate (ROBINUL) 2 MG tablet TAKE 1 TABLET BEFORE MEALS AT BREAKFAST AND DINNER. 60 tablet 6   pantoprazole (PROTONIX) 40 MG tablet Take 1 tablet (40 mg total) by mouth 2 (two) times daily. 60 tablet 2   fluticasone (FLONASE) 50 MCG/ACT nasal spray Place 2 sprays into both nostrils daily. 16 g 6   Current Facility-Administered Medications  Medication Dose Route Frequency Provider Last Rate Last Admin   0.9 %  sodium chloride infusion  500 mL Intravenous Continuous Milus Banister, MD        Allergies as of 02/12/2022   (No Known Allergies)    Family History  Problem Relation Age of Onset   Hypertension Mother    Hypertension Father    Colon cancer Neg Hx    Esophageal cancer Neg Hx    Pancreatic cancer Neg Hx    Stomach cancer Neg Hx     Social History   Socioeconomic History   Marital status: Married    Spouse name: Not on file   Number of children: Not on file   Years of education: Not on file   Highest education level: Not on file  Occupational History   Not on file  Tobacco Use   Smoking status: Never   Smokeless tobacco: Never  Vaping Use   Vaping Use: Never used  Substance and Sexual Activity   Alcohol use: Never   Drug use: Never   Sexual activity: Yes    Partners: Female  Other Topics Concern   Not on file  Social History Narrative   Not on file   Social Determinants of Health   Financial Resource Strain: Not on file  Food Insecurity: Not on file  Transportation Needs: Not on file  Physical Activity: Not on file  Stress: Not on file  Social Connections:  Not on file  Intimate Partner Violence: Not on file     Physical Exam: BP 128/80   Pulse 87   Temp 98.6 F (37 C)   Ht '5\' 3"'$  (1.6 m)   Wt 133 lb (60.3 kg)   LMP 01/25/2022 Comment: BTL  SpO2 100%   BMI 23.56 kg/m  Constitutional: generally well-appearing Psychiatric: alert and oriented x3 Lungs: CTA bilaterally Heart: no MCR  Assessment and plan: 41 y.o. female with epigastric pain, gerd  EGD today  Care is appropriate for the ambulatory setting.  Owens Loffler, MD [facility] 02/12/2022, 9:38 AM

## 2022-02-12 NOTE — Patient Instructions (Addendum)
Take Pantoprazole 40 mg PO daily YOU HAD AN ENDOSCOPIC PROCEDURE TODAY: Refer to the procedure report and other information in the discharge instructions given to you for any specific questions about what was found during the examination. If this information does not answer your questions, please call [place] office at [phone] to clarify.   YOU SHOULD EXPECT: Some feelings of bloating in the abdomen. Passage of more gas than usual. Walking can help get rid of the air that was put into your GI tract during the procedure and reduce the bloating. If you had a lower endoscopy (such as a colonoscopy or flexible sigmoidoscopy) you may notice spotting of blood in your stool or on the toilet paper. Some abdominal soreness may be present for a day or two, also.  DIET: Your first meal following the procedure should be a light meal and then it is ok to progress to your normal diet. A half-sandwich or bowl of soup is an example of a good first meal. Heavy or fried foods are harder to digest and may make you feel nauseous or bloated. Drink plenty of fluids but you should avoid alcoholic beverages for 24 hours. If you had a esophageal dilation, please see attached instructions for diet.    ACTIVITY: Your care partner should take you home directly after the procedure. You should plan to take it easy, moving slowly for the rest of the day. You can resume normal activity the day after the procedure however YOU SHOULD NOT DRIVE, use power tools, machinery or perform tasks that involve climbing or major physical exertion for 24 hours (because of the sedation medicines used during the test).   SYMPTOMS TO REPORT IMMEDIATELY: A gastroenterologist can be reached at any hour. Please call [phone]  for any of the following symptoms:  Following upper endoscopy (EGD, EUS, ERCP, esophageal dilation) Vomiting of blood or coffee ground material  New, significant abdominal pain  New, significant chest pain or pain under  the shoulder blades  Painful or persistently difficult swallowing  New shortness of breath  Black, tarry-looking or red, bloody stools  FOLLOW UP:  If any biopsies were taken you will be contacted by phone or by letter within the next 1-3 weeks. Call [phone]  if you have not heard about the biopsies in 3 weeks.  Please also call with any specific questions about appointments or follow up tests.     USTED TUVO UN PROCEDIMIENTO ENDOSCPICO HOY EN EL [facility]:   Lea el informe del procedimiento que se le entreg para cualquier pregunta especfica sobre lo que se Primary school teacher.  Si el informe del examen no responde a sus preguntas, por favor llame a su gastroenterlogo para aclararlo.  Si usted solicit que no se le den [employer] de lo que se Estate manager/land agent en su procedimiento al [employer] va a cuidar, entonces el informe del procedimiento se ha incluido en un sobre sellado para que usted lo revise despus cuando le sea ms conveniente.   LO QUE PUEDE ESPERAR: Algunas sensaciones de hinchazn en el abdomen.  Puede tener ms gases de lo normal.  El caminar puede ayudarle a eliminar el aire que se le puso en el tracto gastrointestinal durante el procedimiento y reducir la hinchazn.  Si le hicieron una endoscopia inferior (como una colonoscopia o una sigmoidoscopia flexible), podra notar manchas de sangre en las heces fecales o en el papel higinico.  Si se someti a una preparacin intestinal para su procedimiento, es posible  que no tenga una evacuacin intestinal normal durante [employer].   Tenga en cuenta:  Es posible que note un poco de irritacin y congestin en la nariz o algn drenaje.  Esto es debido al oxgeno [employer] durante su procedimiento.  No hay que preocuparse y esto debe desaparecer ms o Scientist, research (medical).   SNTOMAS PARA REPORTAR INMEDIATAMENTE:  Despus de una endoscopia inferior (colonoscopia o sigmoidoscopia flexible):  Cantidades excesivas  de sangre en las heces fecales  Sensibilidad significativa o empeoramiento de los dolores abdominales   Hinchazn aguda del abdomen que antes no tena   Fiebre de 100F o ms   Despus de la endoscopia superior (EGD)  Vmitos de Biochemist, clinical o material como caf molido   Dolor en el pecho o dolor debajo de los omplatos que antes no tena   Dolor o dificultad persistente para tragar  Falta de aire que antes no tena   Fiebre de 100F o ms  Heces fecales negras y pegajosas   Para asuntos urgentes o de Freight forwarder, puede comunicarse con un gastroenterlogo a cualquier hora llamando al [phone].  DIETA:  Recomendamos una comida pequea al principio, pero luego puede continuar con su dieta normal.  Tome muchos lquidos, Teacher, adult education las bebidas alcohlicas durante 24 horas.    ACTIVIDAD:  Debe planear tomarse las cosas con calma por el resto del da y no debe CONDUCIR ni usar maquinaria pesada Programmer, applications (debido a los medicamentos de sedacin utilizados durante el examen).     SEGUIMIENTO: Nuestro personal llamar al nmero que aparece en su historial al siguiente da hbil de su procedimiento para ver cmo se siente y para responder cualquier pregunta o inquietud que pueda tener con respecto a la informacin que se le dio despus del procedimiento. Si no podemos contactarle, le dejaremos un mensaje.  Sin embargo, si se siente bien y no tiene Paediatric nurse, no es necesario que nos devuelva la llamada.  Asumiremos que ha regresado a sus actividades diarias normales sin incidentes. Si se le tomaron algunas biopsias, le contactaremos por telfono o por carta en las prximas 3 semanas.  Si no ha sabido [employer] biopsias en el transcurso de 3 semanas, por favor llmenos al [phone].   FIRMAS/CONFIDENCIALIDAD: Usted y/o el acompaante que le cuide han firmado documentos que se ingresarn en su historial mdico electrnico.  Estas firmas atestiguan el hecho de que la informacin  anterior

## 2022-02-12 NOTE — Progress Notes (Signed)
Called to room to assist during endoscopic procedure.  Patient ID and intended procedure confirmed with present staff. Received instructions for my participation in the procedure from the performing physician.  

## 2022-02-12 NOTE — Op Note (Signed)
[place] Patient Name: Kaitlin Suarez Procedure Date: 02/12/2022 9:37 AM MRN: [id number] Endoscopist: Milus Banister , MD Age: 41 Referring MD:  Date of Birth: 12-04-1980 Gender: Female Account #: [id number] Procedure:                Upper GI endoscopy Indications:              Epigastric abdominal pain, Heartburn Medicines:                Monitored Anesthesia Care Procedure:                Pre-Anesthesia Assessment:                           - Prior to the procedure, a History and Physical                            was performed, and patient medications and                            allergies were reviewed. The patient's tolerance of                            previous anesthesia was also reviewed. The risks                            and benefits of the procedure and the sedation                            options and risks were discussed with the patient.                            All questions were answered, and informed consent                            was obtained. Prior Anticoagulants: The patient has                            taken no previous anticoagulant or antiplatelet                            agents. ASA Grade Assessment: II - A patient with                            mild systemic disease. After reviewing the risks                            and benefits, the patient was deemed in                            satisfactory condition to undergo the procedure.                           After obtaining informed consent, the endoscope was  passed under direct vision. Throughout the                            procedure, the patient's blood pressure, pulse, and                            oxygen saturations were monitored continuously. The                            GIF [id number] #[id number] was introduced through the                            mouth, and advanced to the second part of duodenum.                            The upper GI  endoscopy was accomplished without                            difficulty. The patient tolerated the procedure                            well. Scope In: Scope Out: Findings:                 Minimal inflammation characterized by erythema and                            friability was found in the gastric antrum.                            Biopsies were taken with a cold forceps for                            histology.                           The exam was otherwise without abnormality. Complications:            No immediate complications. Estimated blood loss:                            None. Estimated Blood Loss:     Estimated blood loss: none. Impression:               - Mild, non-specific distal gastritis. Biopsied to                            check for H. pylori.                           - The examination was otherwise normal. Recommendation:           - Patient has a contact number available for                            emergencies. The signs and symptoms of potential  delayed complications were discussed with the                            patient. Return to normal activities tomorrow.                            Written discharge instructions were provided to the                            patient.                           - Resume previous diet.                           - Continue present medications. Please start taking                            the pantoprazole '40mg'$  pills EVERY DAY instead of                            just every few days. Best taken 20-30 minutes                            before your breakfast meal.                           - Await pathology results. Milus Banister, MD 02/12/2022 9:56:44 AM This report has been signed electronically.

## 2022-02-12 NOTE — Progress Notes (Signed)
Report given to PACU, vss 

## 2022-02-15 ENCOUNTER — Telehealth: Payer: Self-pay

## 2022-02-15 NOTE — Telephone Encounter (Signed)
Follow up call to pt, lm for pt to call if having any difficulty with normal activities or eating and drinking.  Also to call if any other questions or concerns.  

## 2022-02-16 ENCOUNTER — Encounter: Payer: Self-pay | Admitting: Gastroenterology

## 2022-05-27 ENCOUNTER — Other Ambulatory Visit: Payer: Self-pay | Admitting: Physician Assistant

## 2022-05-27 DIAGNOSIS — R1013 Epigastric pain: Secondary | ICD-10-CM

## 2022-08-15 IMAGING — DX DG SHOULDER 2+V*L*
3 series · 3 of 3 positions shown · non-contrast
Comparison: None.

CLINICAL DATA: Chronic left shoulder pain.

EXAM:
LEFT SHOULDER - 2+ VIEW

[shoulder ap (1 of 2)]
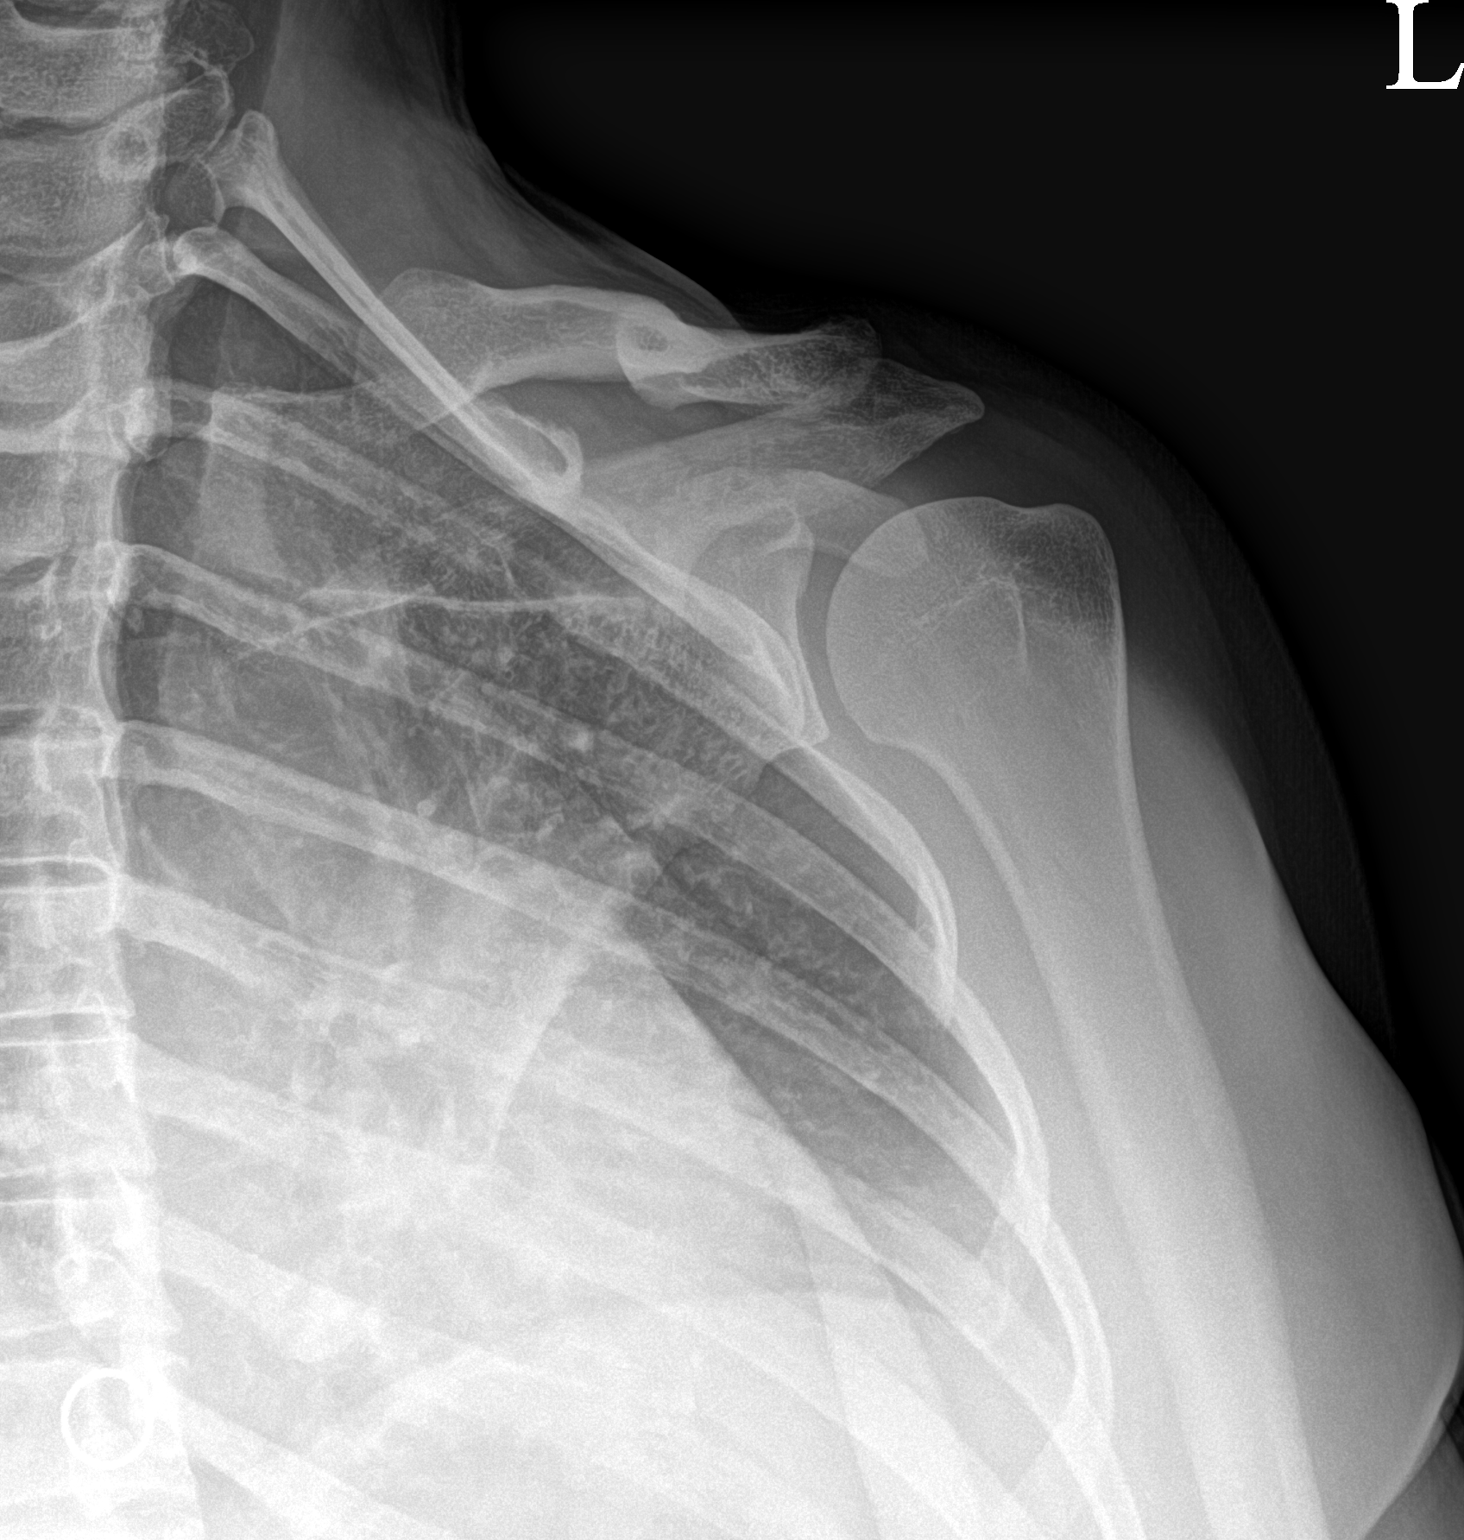

[shoulder ap (2 of 2)]
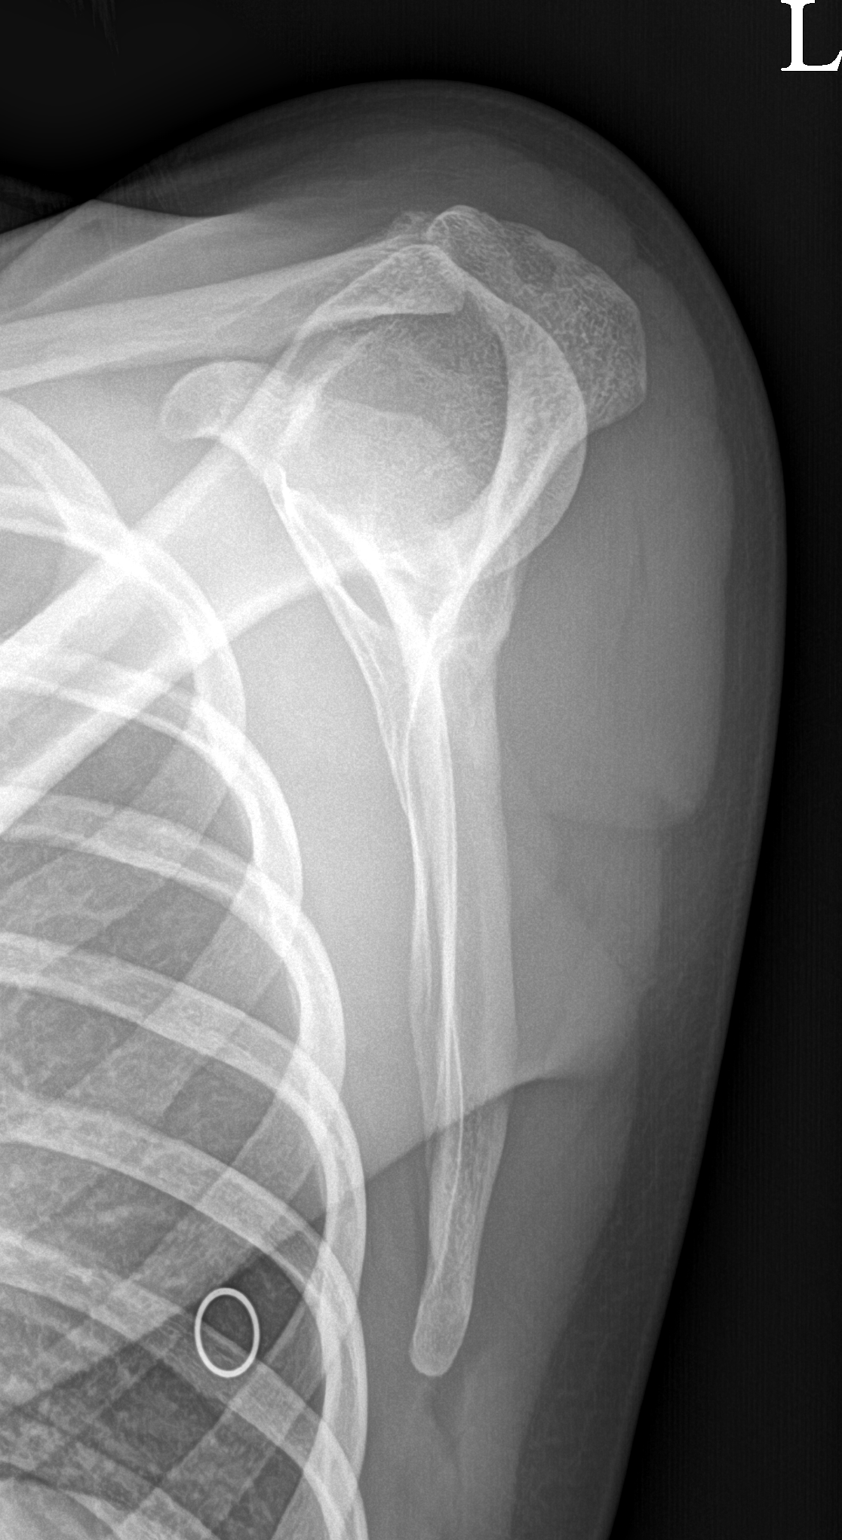

[shoulder axial]
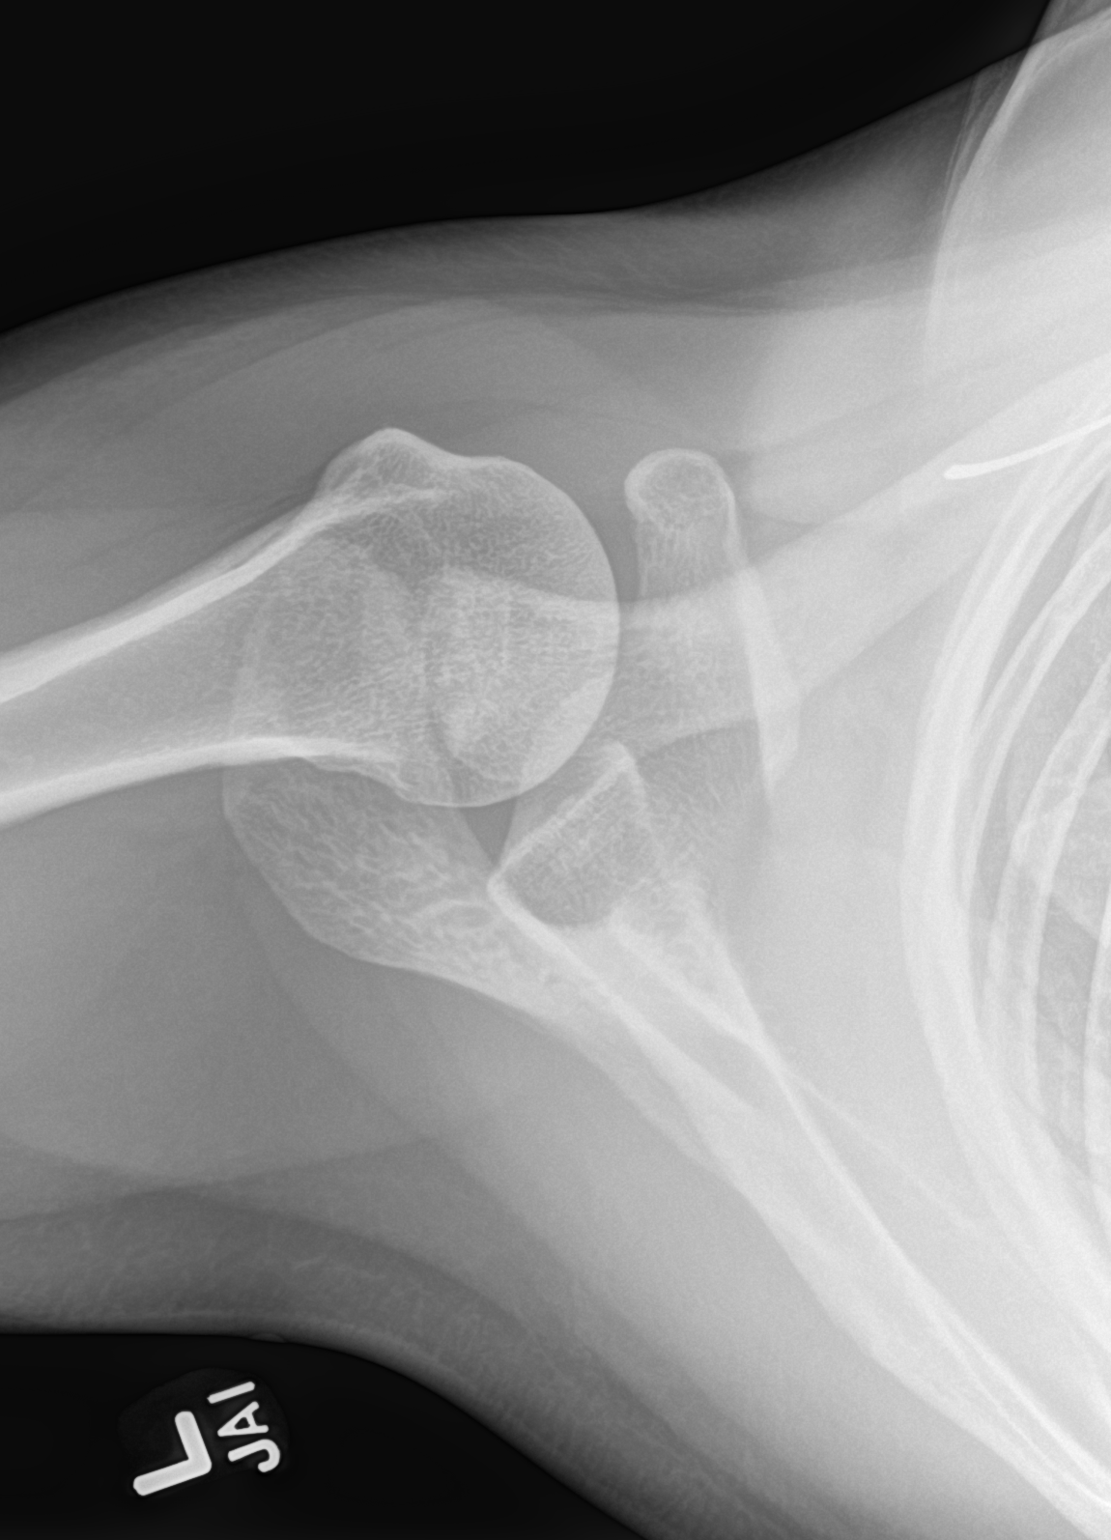

[3 of 3 positions shown; findings below may reference images not displayed]

FINDINGS: Normal anatomic alignment. No evidence for acute fracture or
dislocation. Visualized left hemithorax is unremarkable.
IMPRESSION: No acute osseous abnormality.

## 2023-03-30 ENCOUNTER — Ambulatory Visit (INDEPENDENT_AMBULATORY_CARE_PROVIDER_SITE_OTHER): Payer: Commercial Managed Care - HMO | Admitting: Emergency Medicine

## 2023-03-30 ENCOUNTER — Other Ambulatory Visit (INDEPENDENT_AMBULATORY_CARE_PROVIDER_SITE_OTHER): Payer: Commercial Managed Care - HMO

## 2023-03-30 ENCOUNTER — Encounter: Payer: Self-pay | Admitting: Emergency Medicine

## 2023-03-30 VITALS — BP 142/92 | HR 74 | Temp 98.4°F | Wt 139.2 lb

## 2023-03-30 DIAGNOSIS — R1084 Generalized abdominal pain: Secondary | ICD-10-CM | POA: Diagnosis not present

## 2023-03-30 DIAGNOSIS — R109 Unspecified abdominal pain: Secondary | ICD-10-CM | POA: Diagnosis not present

## 2023-03-30 DIAGNOSIS — M79672 Pain in left foot: Secondary | ICD-10-CM | POA: Diagnosis not present

## 2023-03-30 LAB — COMPREHENSIVE METABOLIC PANEL
ALT: 11 U/L (ref 0–35)
AST: 15 U/L (ref 0–37)
Albumin: 4.2 g/dL (ref 3.5–5.2)
Alkaline Phosphatase: 47 U/L (ref 39–117)
BUN: 8 mg/dL (ref 6–23)
CO2: 28 meq/L (ref 19–32)
Calcium: 9.4 mg/dL (ref 8.4–10.5)
Chloride: 107 meq/L (ref 96–112)
Creatinine, Ser: 0.79 mg/dL (ref 0.40–1.20)
GFR: 91.87 mL/min (ref >60.00)
Glucose, Bld: 75 mg/dL (ref 70–99)
Potassium: 4.3 meq/L (ref 3.5–5.1)
Sodium: 141 meq/L (ref 135–145)
Total Bilirubin: 0.4 mg/dL (ref 0.2–1.2)
Total Protein: 7.4 g/dL (ref 6.0–8.3)

## 2023-03-30 LAB — CBC WITH DIFFERENTIAL/PLATELET
Basophils Absolute: 0 10*3/uL (ref 0.0–0.1)
Basophils Relative: 0.6 % (ref 0.0–3.0)
Eosinophils Absolute: 0.1 10*3/uL (ref 0.0–0.7)
Eosinophils Relative: 1.2 % (ref 0.0–5.0)
HCT: 40.8 % (ref 36.0–46.0)
Hemoglobin: 13.5 g/dL (ref 12.0–15.0)
Lymphocytes Relative: 31.6 % (ref 12.0–46.0)
Lymphs Abs: 2.2 10*3/uL (ref 0.7–4.0)
MCHC: 33 g/dL (ref 30.0–36.0)
MCV: 88.9 fl (ref 78.0–100.0)
Monocytes Absolute: 0.6 10*3/uL (ref 0.1–1.0)
Monocytes Relative: 9.1 % (ref 3.0–12.0)
Neutro Abs: 4 10*3/uL (ref 1.4–7.7)
Neutrophils Relative %: 57.5 % (ref 43.0–77.0)
Platelets: 273 10*3/uL (ref 150.0–400.0)
RBC: 4.59 Mil/uL (ref 3.87–5.11)
RDW: 13.8 % (ref 11.5–15.5)
WBC: 7 10*3/uL (ref 4.0–10.5)

## 2023-03-30 LAB — LIPASE: Lipase: 53 U/L (ref 11.0–59.0)

## 2023-03-30 NOTE — Patient Instructions (Signed)
 Dolor en la dolor en la fosa lumbar en adultos Flank Pain, Adult El dolor en la fosa lumbar es aquel dolor que se siente en un lado del cuerpo. El flanco es la zona que se localiza en un lado del cuerpo, entre la parte superior del vientre (abdomen) y Landscape architect. El Software engineer en un perodo corto de [place] o puede durar mucho tiempo o reaparecer con frecuencia (crnico). Puede ser leve o muy intenso. El dolor en esta zona puede tener diferentes causas. Siga estas instrucciones en su casa:  Beba suficiente lquido para mantener el pis (la orina) de color amarillo plido. Haga reposo como se lo haya indicado el mdico. Use los medicamentos de venta libre y los recetados solamente como se lo haya indicado el mdico. Realice un seguimiento por escrito de lo siguiente: Qu le caus dolor en la fosa lumbar. Qu ha hecho que Chief Technology Officer en el flanco mejore. Concurra a todas las visitas de seguimiento. Comunquese con un mdico si: Los medicamentos no Tourist information centre manager. Aparecen nuevos sntomas. El dolor [place]. Los sntomas duran ms de 2 a [address]. Tiene dificultad para orinar. Orina con ms frecuencia que lo normal. Solicite ayuda de inmediato si: Tiene dificultad para respirar. Le falta el aire. Le duele el vientre, o este est hinchado o enrojecido. Tiene ganas de vomitar (nuseas). Vomita. Siente que podra desmayarse o se desmaya. Observa sangre en la orina. Tiene dolor en la fosa lumbar y fiebre. Estos sntomas pueden Customer service manager. Solicite ayuda de inmediato. Comunquese con el servicio de emergencias de su localidad (911 en los [place]). No espere a ver si los sntomas desaparecen. No conduzca por sus propios medios [employer]. Resumen El dolor en la fosa lumbar es aquel dolor que se siente en un lado del cuerpo. El flanco es la zona que se localiza en un lado del cuerpo, entre la parte superior del vientre (abdomen) y Landscape architect. El dolor  en la fosa lumbar puede aparecer en un perodo corto de tiempo (agudo) o puede durar mucho tiempo o reaparecer con frecuencia (crnico). Puede ser leve o muy intenso. El dolor en esta zona puede tener diferentes causas. Comunquese con su mdico si los sntomas empeoran o si duran ms de 2 a 3 das. Esta informacin no tiene Theme park manager el consejo del mdico. Asegrese de hacerle al mdico cualquier pregunta que tenga. Document Revised: 10/14/2020 Document Reviewed: 10/14/2020 Elsevier Patient Education  2024 [employer].

## 2023-03-30 NOTE — Assessment & Plan Note (Signed)
Clinically stable.  No red flag signs or symptoms Benign abdominal examination Intermittent symptoms without associated symptomatology Recommend blood work and abdominal ultrasound

## 2023-03-30 NOTE — Assessment & Plan Note (Signed)
Clinically stable.  No red flag signs or symptoms. Differential diagnosis discussed.  Most likely musculoskeletal in nature Has history of kidney stones. Recommend urinalysis, blood work, and kidney ultrasound Pain management discussed May take Tylenol and or Advil as needed for pain

## 2023-03-30 NOTE — Assessment & Plan Note (Signed)
Since injury last March.  Symptoms not getting better Recommend podiatry evaluation Referral placed today

## 2023-03-30 NOTE — Progress Notes (Signed)
Kaitlin Suarez 42 y.o.   Chief Complaint  Patient presents with   Back Pain    Back pain started x3 months ago on the right side, pain started radiating across the lower back, more painful when laying down  Patient also states foot pain that started in March 2024    HISTORY OF PRESENT ILLNESS: [name] complaining of daily constant right flank pain for the past 3 months. No urinary symptoms.  Physical work. Also complaining of left foot pain since injury last March. Also complaining of intermittent diffuse abdominal pain without nausea or vomiting. No other associated symptoms. No other complaints or medical concerns today.  Back Pain Associated symptoms include abdominal pain. Pertinent negatives include no chest pain, fever or headaches.     Prior to Admission medications   Medication Sig Start Date End Date Taking? Authorizing Provider  pantoprazole (PROTONIX) 40 MG tablet TAKE 1 TABLET BY MOUTH TWICE A DAY 05/27/22  Yes Unk Lightning, PA  fluticasone (FLONASE) 50 MCG/ACT nasal spray Place 2 sprays into both nostrils daily. Patient not taking: Reported on 03/30/2023 06/12/21   Shirline Frees, NP  glycopyrrolate (ROBINUL) 2 MG tablet TAKE 1 TABLET BEFORE MEALS AT BREAKFAST AND DINNER. Patient not taking: Reported on 03/30/2023 09/17/21   Esterwood, Amy S, PA-C    No Known Allergies  There are no problems to display for this patient.   Past Medical History:  Diagnosis Date   GERD (gastroesophageal reflux disease)     Past Surgical History:  Procedure Laterality Date   CESAREAN SECTION      Social History   Socioeconomic History   Marital status: Married    Spouse name: Not on file   Number of children: Not on file   Years of education: Not on file   Highest education level: Not on file  Occupational History   Not on file  Tobacco Use   Smoking status: Never   Smokeless tobacco: Never  Vaping Use   Vaping status: Never Used   Substance and Sexual Activity   Alcohol use: Never   Drug use: Never   Sexual activity: Yes    Partners: Female  Other Topics Concern   Not on file  Social History Narrative   Not on file   Social Determinants of Health   Financial Resource Strain: Not on file  Food Insecurity: Not on file  Transportation Needs: Not on file  Physical Activity: Not on file  Stress: Not on file  Social Connections: Not on file  Intimate Partner Violence: Not on file    Family History  Problem Relation Age of Onset   Hypertension Mother    Hypertension Father    Colon cancer Neg Hx    Esophageal cancer Neg Hx    Pancreatic cancer Neg Hx    Stomach cancer Neg Hx      Review of Systems  Constitutional: Negative.  Negative for chills and fever.  HENT: Negative.  Negative for congestion and sore throat.   Respiratory: Negative.  Negative for cough and shortness of breath.   Cardiovascular: Negative.  Negative for chest pain and palpitations.  Gastrointestinal:  Positive for abdominal pain. Negative for blood in stool, diarrhea, melena, nausea and vomiting.  Genitourinary:  Positive for flank pain.  Musculoskeletal:  Positive for back pain.  Skin: Negative.  Negative for rash.  Neurological: Negative.  Negative for dizziness and headaches.  All other systems reviewed and are negative.   Vitals:  03/30/23 1621  BP: (!) 142/92  Pulse: 74  Temp: 98.4 F (36.9 C)  SpO2: 99%    Physical Exam Vitals reviewed.  Constitutional:      Appearance: Normal appearance.  HENT:     Head: Normocephalic.     Mouth/Throat:     Mouth: Mucous membranes are moist.     Pharynx: Oropharynx is clear.  Eyes:     Extraocular Movements: Extraocular movements intact.     Conjunctiva/sclera: Conjunctivae normal.     Pupils: Pupils are equal, round, and reactive to light.  Cardiovascular:     Rate and Rhythm: Normal rate and regular rhythm.     Pulses: Normal pulses.     Heart sounds: Normal heart  sounds.  Pulmonary:     Effort: Pulmonary effort is normal.     Breath sounds: Normal breath sounds.  Abdominal:     Palpations: Abdomen is soft.     Tenderness: There is no abdominal tenderness. There is no right CVA tenderness or left CVA tenderness.  Musculoskeletal:     Cervical back: No tenderness.     Comments: Left foot: No ecchymosis or erythema.  No swelling.  No deformities.  Neurovascularly intact.  No abnormal findings.  Lymphadenopathy:     Cervical: No cervical adenopathy.  Skin:    General: Skin is warm and dry.     Capillary Refill: Capillary refill takes less than 2 seconds.  Neurological:     General: No focal deficit present.     Mental Status: She is alert and oriented to person, place, and time.  Psychiatric:        Mood and Affect: Mood normal.        Behavior: Behavior normal.      ASSESSMENT & PLAN: A total of 42 minutes was spent with the patient and counseling/coordination of care regarding preparing for this visit, review of most recent office visit notes, review of most recent blood work results, differential diagnosis of flank pain and need for workup including urinalysis, blood work, and ultrasounds, education on nutrition, prognosis, documentation, and need for follow-up.  Problem List Items Addressed This Visit       Other   Left foot pain    Since injury last March.  Symptoms not getting better Recommend podiatry evaluation Referral placed today      Relevant Orders   Ambulatory referral to Podiatry   Right flank pain - Primary    Clinically stable.  No red flag signs or symptoms. Differential diagnosis discussed.  Most likely musculoskeletal in nature Has history of kidney stones. Recommend urinalysis, blood work, and kidney ultrasound Pain management discussed May take Tylenol and or Advil as needed for pain      Relevant Orders   US Abdomen Complete   CBC with Differential/Platelet   Comprehensive metabolic panel   Lipase    Urinalysis   Generalized abdominal pain    Clinically stable.  No red flag signs or symptoms Benign abdominal examination Intermittent symptoms without associated symptomatology Recommend blood work and abdominal ultrasound      Relevant Orders   US Abdomen Complete   CBC with Differential/Platelet   Comprehensive metabolic panel   Lipase   Urinalysis   Patient Instructions  Dolor en la dolor en la fosa lumbar en adultos Flank Pain, Adult El dolor en la fosa lumbar es aquel dolor que se siente en un lado del cuerpo. El flanco es la zona que se localiza en un lado del cuerpo,  entre la parte superior del vientre (abdomen) y Landscape architect. El Software engineer en un perodo corto de [place]) o puede durar mucho tiempo o reaparecer con frecuencia (crnico). Puede ser leve o muy intenso. El dolor en esta zona puede tener diferentes causas. Siga estas instrucciones en su casa:  Beba suficiente lquido para mantener el pis (la orina) de color amarillo plido. Haga reposo como se lo haya indicado el mdico. Use los medicamentos de venta libre y los recetados solamente como se lo haya indicado el mdico. Realice un seguimiento por escrito de lo siguiente: Qu le caus dolor en la fosa lumbar. Qu ha hecho que Chief Technology Officer en el flanco mejore. Concurra a todas las visitas de seguimiento. Comunquese con un mdico si: Los medicamentos no Tourist information centre manager. Aparecen nuevos sntomas. El dolor [place]. Los sntomas duran ms de 2 a [address]. Tiene dificultad para orinar. Orina con ms frecuencia que lo normal. Solicite ayuda de inmediato si: Tiene dificultad para respirar. Le falta el aire. Le duele el vientre, o este est hinchado o enrojecido. Tiene ganas de vomitar (nuseas). Vomita. Siente que podra desmayarse o se desmaya. Observa sangre en la orina. Tiene dolor en la fosa lumbar y fiebre. Estos sntomas pueden Customer service manager. Solicite ayuda de inmediato. Comunquese con  el servicio de emergencias de su localidad (911 en los [place]). No espere a ver si los sntomas desaparecen. No conduzca por sus propios medios [employer]. Resumen El dolor en la fosa lumbar es aquel dolor que se siente en un lado del cuerpo. El flanco es la zona que se localiza en un lado del cuerpo, entre la parte superior del vientre (abdomen) y Landscape architect. El dolor en la fosa lumbar puede aparecer en un perodo corto de tiempo (agudo) o puede durar mucho tiempo o reaparecer con frecuencia (crnico). Puede ser leve o muy intenso. El dolor en esta zona puede tener diferentes causas. Comunquese con su mdico si los sntomas empeoran o si duran ms de 2 a 3 das. Esta informacin no tiene Theme park manager el consejo del mdico. Asegrese de hacerle al mdico cualquier pregunta que tenga. Document Revised: 10/14/2020 Document Reviewed: 10/14/2020 Elsevier Patient Education  2024 Elsevier Inc.     Edwina Barth, MD [facility]

## 2023-03-31 ENCOUNTER — Ambulatory Visit
Admission: RE | Admit: 2023-03-31 | Discharge: 2023-03-31 | Disposition: A | Discharge status: Home / Self Care | Payer: Commercial Managed Care - HMO | Source: Ambulatory Visit | Attending: Emergency Medicine | Admitting: Emergency Medicine

## 2023-03-31 DIAGNOSIS — R1084 Generalized abdominal pain: Secondary | ICD-10-CM

## 2023-03-31 DIAGNOSIS — R109 Unspecified abdominal pain: Secondary | ICD-10-CM

## 2023-03-31 LAB — URINALYSIS
Bilirubin Urine: NEGATIVE
Hgb urine dipstick: NEGATIVE
Ketones, ur: NEGATIVE
Leukocytes,Ua: NEGATIVE
Nitrite: NEGATIVE
Specific Gravity, Urine: <=1.005 — AB (ref 1.000–1.030)
Total Protein, Urine: NEGATIVE
Urine Glucose: NEGATIVE
Urobilinogen, UA: 0.2 (ref 0.0–1.0)
pH: 7 (ref 5.0–8.0)

## 2023-04-11 ENCOUNTER — Encounter: Payer: Self-pay | Admitting: Podiatry

## 2023-04-11 ENCOUNTER — Ambulatory Visit: Payer: Commercial Managed Care - HMO | Admitting: Podiatry

## 2023-04-11 ENCOUNTER — Ambulatory Visit (INDEPENDENT_AMBULATORY_CARE_PROVIDER_SITE_OTHER): Payer: Commercial Managed Care - HMO

## 2023-04-11 DIAGNOSIS — M21619 Bunion of unspecified foot: Secondary | ICD-10-CM

## 2023-04-11 DIAGNOSIS — M778 Other enthesopathies, not elsewhere classified: Secondary | ICD-10-CM

## 2023-04-11 DIAGNOSIS — M7752 Other enthesopathy of left foot: Secondary | ICD-10-CM | POA: Diagnosis not present

## 2023-04-11 MED ORDER — TRIAMCINOLONE ACETONIDE 10 MG/ML IJ SUSP
10.0000 mg | Freq: Once | INTRAMUSCULAR | Status: AC
Start: 2023-04-11 — End: ?

## 2023-04-12 DIAGNOSIS — M778 Other enthesopathies, not elsewhere classified: Secondary | ICD-10-CM | POA: Diagnosis not present

## 2023-04-12 MED ORDER — TRIAMCINOLONE ACETONIDE 10 MG/ML IJ SUSP
10.0000 mg | Freq: Once | INTRAMUSCULAR | Status: AC
Start: 2023-04-12 — End: 2023-04-12
  Administered 2023-04-12: 10 mg via INTRA_ARTICULAR

## 2023-04-12 NOTE — Progress Notes (Signed)
Subjective:   Patient ID: Kaitlin Suarez, female   DOB: 42 y.o.   MRN: [id number]   HPI Patient presents with interpreter with a large structural bunion deformity left which apparently is occurred over the last 6 months with history of trauma to the area and twisting her foot.  States has been very sore around the joint and hard to walk with.  Patient tries to be active does not smoke   Review of Systems  All other systems reviewed and are negative.       Objective:  Physical Exam Vitals and nursing note reviewed.  Constitutional:      Appearance: She is well-developed.  Pulmonary:     Effort: Pulmonary effort is normal.  Musculoskeletal:        General: Normal range of motion.  Skin:    General: Skin is warm.  Neurological:     Mental Status: She is alert.     Neurovascular status intact muscle strength found to be adequate range of motion adequate with inflammation pain around the first MPJ left and moderate to large structural bunion deformity with pain on the side also.  Good digital perfusion well-oriented x 3 stating it occurred since the injury     Assessment:  Will be for some type of ligament damage causing abnormal structural bone deformity around the first MPJ left with inflammatory capsulitis     Plan:  H&P x-ray reviewed condition discussed sterile prep and injected around the joint periarticular 3 mg Kenalog 5 mg Xylocaine advised on wider shoes and patient will be seen back to recheck  X-rays indicate that there is elevation of the intermetatarsal angle left and indications of structural bunion with elevation of the angle of approximate 15 degrees

## 2023-05-02 ENCOUNTER — Ambulatory Visit: Payer: Commercial Managed Care - HMO | Admitting: Podiatry

## 2023-06-05 ENCOUNTER — Other Ambulatory Visit: Payer: Self-pay | Admitting: Physician Assistant

## 2023-06-05 DIAGNOSIS — R1013 Epigastric pain: Secondary | ICD-10-CM

## 2023-06-16 ENCOUNTER — Other Ambulatory Visit: Payer: Self-pay | Admitting: Physician Assistant

## 2023-06-16 DIAGNOSIS — R1013 Epigastric pain: Secondary | ICD-10-CM

## 2023-06-20 ENCOUNTER — Other Ambulatory Visit: Payer: Self-pay | Admitting: Physician Assistant

## 2023-06-20 DIAGNOSIS — R1013 Epigastric pain: Secondary | ICD-10-CM

## 2023-06-21 NOTE — Telephone Encounter (Signed)
Please submit for PA for twice daily, once daily not effective.

## 2023-06-22 ENCOUNTER — Other Ambulatory Visit (HOSPITAL_COMMUNITY): Payer: Self-pay

## 2023-06-22 ENCOUNTER — Telehealth: Payer: Self-pay | Admitting: Pharmacy Technician

## 2023-06-22 NOTE — Telephone Encounter (Signed)
PA has been submitted, and telephone encounter has been created. 

## 2023-06-22 NOTE — Telephone Encounter (Signed)
Pharmacy Patient Advocate Encounter   Received notification from RX Request Messages that prior authorization for PANTOPRAZOLE 40MG  is required/requested.   Insurance verification completed.   The patient is insured through [employer] .   Per test claim: PA required; PA submitted to above mentioned insurance via CoverMyMeds Key/confirmation #/EOC [id number] Status is pending

## 2023-06-30 NOTE — Telephone Encounter (Signed)
Can I get an update on this PA please.

## 2023-07-05 ENCOUNTER — Other Ambulatory Visit (HOSPITAL_COMMUNITY): Payer: Self-pay

## 2023-07-05 NOTE — Telephone Encounter (Signed)
Pharmacy Patient Advocate Encounter  Received notification from [employer] that Prior Authorization for Pantoprazole Sodium 40MG  dr has been APPROVED from 06/22/23 to 06/21/24. Ran test claim, Copay is $0.00. This test claim was processed through [facility]- copay amounts may vary at other pharmacies due to pharmacy/plan contracts, or as the patient moves through the different stages of their insurance plan.   PA #/Case ID/Reference #: [id number]
